# Patient Record
Sex: Female | Born: 1990 | Race: Black or African American | Hispanic: No | Smoking: Never smoker
Health system: Southern US, Community
[De-identification: ages and names within clinical notes are randomized; demographics above are authoritative.]

## PROBLEM LIST (undated history)

## (undated) HISTORY — PX: WISDOM TOOTH EXTRACTION: SHX21

---

## 2009-07-03 ENCOUNTER — Ambulatory Visit: Payer: Self-pay | Admitting: Family Medicine

## 2009-07-03 DIAGNOSIS — N912 Amenorrhea, unspecified: Secondary | ICD-10-CM

## 2009-07-03 DIAGNOSIS — R5381 Other malaise: Secondary | ICD-10-CM

## 2009-07-03 DIAGNOSIS — R5383 Other fatigue: Secondary | ICD-10-CM

## 2009-10-24 ENCOUNTER — Ambulatory Visit: Payer: Self-pay | Admitting: Family Medicine

## 2009-10-24 DIAGNOSIS — J209 Acute bronchitis, unspecified: Secondary | ICD-10-CM | POA: Insufficient documentation

## 2009-10-26 ENCOUNTER — Ambulatory Visit (HOSPITAL_COMMUNITY): Admission: RE | Admit: 2009-10-26 | Discharge: 2009-10-26 | Payer: Self-pay | Admitting: Family Medicine

## 2010-02-03 ENCOUNTER — Ambulatory Visit: Payer: Self-pay | Admitting: Family Medicine

## 2010-02-03 ENCOUNTER — Encounter: Payer: Self-pay | Admitting: Physician Assistant

## 2010-02-03 DIAGNOSIS — J039 Acute tonsillitis, unspecified: Secondary | ICD-10-CM | POA: Insufficient documentation

## 2010-02-04 ENCOUNTER — Telehealth: Payer: Self-pay | Admitting: Family Medicine

## 2010-07-08 ENCOUNTER — Encounter: Payer: Self-pay | Admitting: Physician Assistant

## 2010-07-08 ENCOUNTER — Ambulatory Visit: Payer: Self-pay | Admitting: Family Medicine

## 2010-07-08 DIAGNOSIS — R002 Palpitations: Secondary | ICD-10-CM | POA: Insufficient documentation

## 2010-07-08 LAB — CONVERTED CEMR LAB
MCHC: 32.9 g/dL (ref 30.0–36.0)
MCV: 85.3 fL (ref 78.0–100.0)
Platelets: 263 10*3/uL (ref 150–400)
RDW: 13 % (ref 11.5–15.5)
WBC: 4.8 10*3/uL (ref 4.0–10.5)

## 2010-07-09 ENCOUNTER — Encounter: Payer: Self-pay | Admitting: Physician Assistant

## 2010-11-02 ENCOUNTER — Encounter: Payer: Self-pay | Admitting: Family Medicine

## 2010-11-03 ENCOUNTER — Encounter: Payer: Self-pay | Admitting: Family Medicine

## 2010-11-11 NOTE — Letter (Signed)
Summary: Laboratory/X-Ray Results  [facility]  [address]   [place], [place] [address]   Phone: [phone]  Fax: [phone]    Lab/X-Ray Results  July 09, 2010  MRN: [id number]  Madison Harper [address] [place], [place]  [address]    The results of your recent lab/x-ray has been reviewed and were found:      Lab work is normal.   If you have any questions, please contact our office.     Esperanza Sheets PA

## 2010-11-11 NOTE — Progress Notes (Signed)
  Phone Note Call from Patient   Caller: Mom Summary of Call: mother states tylenol is not working for temperature, states 103  advised to give ibuprofen and if no improvement patient is to go to er- per Esperanza Sheets  mother agrees Initial call taken by: Adella Hare LPN,  February 04, 2010 4:43 PM

## 2010-11-11 NOTE — Letter (Signed)
Summary: Out of Work  [facility]  [address]   [place], [place] [address]   Phone: [phone]  Fax: [phone]    February 03, 2010   Employee:  YISSEL HABERMEHL    To Whom It May Concern:   For Medical reasons, please excuse the above named employee from work for the following dates:  Start:   02/03/10  End:   02/06/10 may return to work without restriction.  If you need additional information, please feel free to contact our office.         Sincerely,    Esperanza Sheets PA

## 2010-11-11 NOTE — Letter (Signed)
Summary: Out of Work  [facility]  [address]   [place], [place] [address]   Phone: [phone]  Fax: [phone]    October 24, 2009   Employee:  Madison Harper    To Whom It May Concern:   For Medical reasons, please excuse the above named employee from work for the following dates:  Start:   10/25/09  End:   10/31/09 to return with no restrictions  If you need additional information, please feel free to contact our office.         Sincerely,    Milus Mallick. Lodema Hong, MD

## 2010-11-11 NOTE — Assessment & Plan Note (Signed)
Summary: palpitations- room 2   Vital Signs:  Patient profile:   20 year old female Menstrual status:  irregular Height:      64 inches Weight:      106.13 pounds BMI:     18.28 O2 Sat:      100 % on Room air Pulse rate:   85 / minute Resp:     16 per minute BP sitting:   102 / 70  (left arm)  Vitals Entered By: Adella Hare LPN (July 08, 2010 1:48 PM)  Serial Vital Signs/Assessments:  Time      Position  BP       Pulse  Resp  Temp     By                              80                    Esperanza Sheets PA  CC: palpitations Is Patient Diabetic? No Pain Assessment Patient in pain? no        Primary Tonae Livolsi:  Kerri Perches, MD  CC:  palpitations.  History of Present Illness: Pt reports episodes of heart racing x 5 days.  No prev hx of.  Lasts x approx 3 mins.  Occurs at rest.  No  anxiety/panic.  NO chest pain or difficulty breathing. Admits to drinking caffeinated beverages all day for the last approx 1 week.  "and all I eat is [employer]un [employer] wild sauce."     Current Medications (verified): 1)  None  Allergies (verified): No Known Drug Allergies  Past History:  Past medical history reviewed for relevance to current acute and chronic problems.  Past Medical History: Reviewed history from 07/03/2009 and no changes required. childhood seizures, 2 in her lifetime was on medication, last  on med in 2nd grade  Review of Systems CV:  Complains of palpitations; denies chest pain or discomfort and shortness of breath with exertion. Resp:  Denies shortness of breath.  Physical Exam  General:  Well-developed,well-nourished,in no acute distress; alert,appropriate and cooperative throughout examination Head:  Normocephalic and atraumatic without obvious abnormalities. No apparent alopecia or balding. Ears:  External ear exam shows no significant lesions or deformities.  Otoscopic examination reveals clear canals, tympanic membranes are intact bilaterally  without bulging, retraction, inflammation or discharge. Hearing is grossly normal bilaterally. Nose:  External nasal examination shows no deformity or inflammation. Nasal mucosa are pink and moist without lesions or exudates. Mouth:  Oral mucosa and oropharynx without lesions or exudates.  Teeth in good repair. Neck:  No deformities, masses, or tenderness noted. Lungs:  Normal respiratory effort, chest expands symmetrically. Lungs are clear to auscultation, no crackles or wheezes. Heart:  Normal rate and regular rhythm. S1 and S2 normal without gallop, murmur, click, rub or other extra sounds. Cervical Nodes:  No lymphadenopathy noted Psych:  Cognition and judgment appear intact. Alert and cooperative with normal attention span and concentration. No apparent delusions, illusions, hallucinations   Impression & Recommendations:  Problem # 1:  PALPITATIONS (ICD-785.1) Assessment New  Discussed with pt that her increased caffeine intake is the most likely cause for her symptoms.  Discussed decreased caffeine intake and improved diet overall.  Orders: T-CBC No Diff ([id number]) T-TSH ([id number])  Other Orders: EKG w/ Interpretation (93[address])  Patient Instructions: 1)  Please schedule a follow-up appointment in 2 weeks. Sooner if worsens. 2)  I believe that  caffeine is causing your palpitations. I want you to decrease your caffeine intake by half , and gradually decrease your intake of caffeine to one 12oz serving a day or less.  If you get headaches from caffeine withdrawal you may use an over the counter pain reliever such as Ibuprofen.  Do not take an over the counter headache medicine that has caffeine in it. 3)  Have blood work drawn today.

## 2010-11-11 NOTE — Assessment & Plan Note (Signed)
Summary: sore throat- room 1   Vital Signs:  Patient profile:   20 year old female Menstrual status:  irregular Height:      64 inches Weight:      103.50 pounds BMI:     17.83 O2 Sat:      97 % on Room air Temp:     99.5 degrees F oral Pulse rate:   116 / minute Resp:     16 per minute BP sitting:   120 / 70  (left arm)  Vitals Entered By: Adella Hare LPN (February 03, 2010 9:11 AM) CC: sore throat, fever off and on, chills   x 3 days Is Patient Diabetic? No Pain Assessment Patient in pain? no        Primary Provider:  Kerri Perches, MD  CC:  sore throat, fever off and on, and chills   x 3 days.  History of Present Illness: This 20 Years Old Black Female comes in today complaining of a sore throat. The patient denies drooling, trouble swallowing, or trouble breathing. There is/is not a history of recent exposure to strep. The patient does not give a history of cold or URI symptoms.  Sore throat started Fri 4/22 and is worsening.  She is taking an over the counter cold medication with pain reliever in it to help. Appetitie is decreased, but no N/V. Has had some fatige which she contributes to the cold medication she is taking.    Current Medications (verified): 1)  None  Allergies (verified): No Known Drug Allergies  Past History:  Past medical history reviewed for relevance to current acute and chronic problems.  Past Medical History: Reviewed history from 07/03/2009 and no changes required. childhood seizures, 2 in her lifetime was on medication, last  on med in 2nd grade  Review of Systems General:  Complains of chills, fatigue, fever, and loss of appetite. ENT:  Complains of sore throat; denies earache and nasal congestion. CV:  Denies chest pain or discomfort. Resp:  Denies cough and shortness of breath. GI:  Complains of loss of appetite; denies abdominal pain, nausea, and vomiting.  Physical Exam  General:  Well-developed,well-nourished,in no acute  distress; alert,appropriate and cooperative throughout examination.   Head:  Normocephalic and atraumatic without obvious abnormalities. No apparent alopecia or balding. Ears:  External ear exam shows no significant lesions or deformities.  Otoscopic examination reveals clear canals, tympanic membranes are intact bilaterally without bulging, retraction, inflammation or discharge. Hearing is grossly normal bilaterally. Nose:  External nasal examination shows no deformity or inflammation. Nasal mucosa are pink and moist without lesions or exudates. Mouth:  Tonsils 3+ with exudate.  Mild erythema of uvula noted too. good dentition, no aphthous ulcers, no tongue abnormalities, and no petechiae.   Neck:  No deformities, masses, or tenderness noted. Lungs:  Normal respiratory effort, chest expands symmetrically. Lungs are clear to auscultation, no crackles or wheezes. Heart:  Normal rate and regular rhythm. S1 and S2 normal without gallop, murmur, click, rub or other extra sounds. Abdomen:  soft, non-tender, no hepatomegaly, and no splenomegaly.   Cervical Nodes:  1+ tonsilar nodes bilat. Psych:  Cognition and judgment appear intact. Alert and cooperative with normal attention span and concentration. No apparent delusions, illusions, hallucinations   Impression & Recommendations:  Problem # 1:  TONSILLITIS, ACUTE (ICD-463) Assessment New If no improv by Thurs 4/28 will order CBC & mono. Advised pt if she has increased swelling that causes difficulty breathing or swallowing she needs  to go to ER. Orders: Rocephin  250mg  (Y6948) Admin of Therapeutic Inj  intramuscular or subcutaneous (54627)  Complete Medication List: 1)  Amoxicillin 500 Mg Caps (Amoxicillin) .... Take 1 three times a day for ten days  Other Orders: Rapid Strep (03500)  Patient Instructions: 1)  Call the office on Thurs 02/06/10 if you are not noticing improvement.  Sooner if you feel that you are worsening. 2)  You may use  Tylenol or Ibuprofen as needed for fever and discomfort. 3)  Take 650-1000mg  of Tylenol every 4-6 hours as needed for relief of pain or comfort of fever AVOID taking more than 4000mg   in a 24 hour period (can cause liver damage in higher doses). 4)  Take 400-600mg  of Ibuprofen (Advil, Motrin) with food every 4-6 hours as needed for relief of pain or comfort of fever. 5)  Increase fluids. 6)  I have prescribed an antibiotic for you to take.  You can start this today. 7)  You have also received an antibiotic shot today, Rocephin. Prescriptions: AMOXICILLIN 500 MG CAPS (AMOXICILLIN) take 1 three times a day for ten days  #30 x 0   Entered and Authorized by:   Esperanza Sheets PA   Signed by:   Esperanza Sheets PA on 02/03/2010   Method used:   Electronically to        [facility] (retail)       [address]       [place], [place]  [address]       Ph: [phone]       Fax: [phone]   RxID:   [id number]   Laboratory Results  Date/Time Received: February 03, 2010  Date/Time Reported: February 03, 2010   Other Tests  Rapid Strep: negative     Medication Administration  Injection # 1:    Medication: Rocephin  250mg     Diagnosis: TONSILLITIS, ACUTE (ICD-463)    Route: IM    Site: RUOQ gluteus    Exp Date: 8/13    Lot #: [id number]    Mfr: [email or web address]    Comments: rocephin 1 gram given    Patient tolerated injection without complications    Given by: Adella Hare LPN (February 03, 2010 9:35 AM)  Orders Added: 1)  Rapid Strep [40086] 2)  Rocephin  250mg  [J0696] 3)  Admin of Therapeutic Inj  intramuscular or subcutaneous [96372] 4)  Est. Patient Level IV [76195]

## 2010-11-11 NOTE — Assessment & Plan Note (Signed)
Summary: SICK   Vital Signs:  Patient profile:   20 year old female Menstrual status:  irregular Height:      64 inches Weight:      104.25 pounds BMI:     17.96 O2 Sat:      98 % on Room air Temp:     98.1 degrees F oral Pulse rate:   96 / minute Pulse rhythm:   regular Resp:     16 per minute BP sitting:   100 / 70  (left arm)  Vitals Entered By: Worthy Keeler LPN (October 24, 2009 4:16 PM)  O2 Flow:  Room air CC: fever, sore throat, cough, yellow sputum, runny nose x 4 days Is Patient Diabetic? No Pain Assessment Patient in pain? no        Primary Care Provider:  Kerri Perches, MD  CC:  fever, sore throat, cough, yellow sputum, and runny nose x 4 days.  History of Present Illness: 4 day h/o cough, chest congestion with green sptum, fever and chills, positive sick contact. she denies sinus pressure, ear pain, nasal congestion or ore throat. She was doing well prior to this.  Allergies (verified): No Known Drug Allergies  Review of Systems      See HPI General:  Complains of chills, fatigue, fever, and malaise. ENT:  Denies hoarseness, nasal congestion, sinus pressure, and sore throat. Resp:  Complains of cough and sputum productive. GI:  Denies abdominal pain, constipation, diarrhea, nausea, and vomiting. GU:  Denies dysuria and urinary frequency; regular menstrual cycle on oCP.  Physical Exam  General:  Well-developed,well-nourished,in no acute distress; alert,appropriate and cooperative throughout examination. iLL appearingh HEENT: No facial asymmetry,  EOMI, No sinus tenderness, TM's Clear, oropharynx  pink and moist.   Chest: decreased air entry, bilateral crackles, no wheezes CVS: S1, S2, No murmurs, No S3.   Abd: Soft, Nontender.  MS: Adequate ROM spine, hips, shoulders and knees.  Ext: No edema.   CNS: CN 2-12 intact, power tone and sensation normal throughout.   Skin: Intact, no visible lesions or rashes.  Psych: Good eye contact, normal  affect.  Memory intact, not anxious or depressed appearing.    Impression & Recommendations:  Problem # 1:  ACUTE BRONCHITIS (ICD-466.0) Assessment Comment Only  Her updated medication list for this problem includes:    Penicillin V Potassium 500 Mg Tabs (Penicillin v potassium) .Marland Kitchen... Take 1 tablet by mouth three times a day    Tessalon Perles 100 Mg Caps (Benzonatate) .Marland Kitchen... Take 1 capsule by mouth three times a day  Orders: CXR- 2view (CXR) Rocephin  250mg  (Z3086) Admin of Therapeutic Inj  intramuscular or subcutaneous (57846)  Complete Medication List: 1)  Ortho Tri-cyclen (28) 0.18/0.215/0.25 Mg-35 Mcg Tabs (Norgestim-eth estrad triphasic) .... Start on 2nd day of cycle. take one tab once daily 2)  Provera 5 Mg Tabs (Medroxyprogesterone acetate) .... Take 1 tablet by mouth two times a day 3)  Penicillin V Potassium 500 Mg Tabs (Penicillin v potassium) .... Take 1 tablet by mouth three times a day 4)  Tessalon Perles 100 Mg Caps (Benzonatate) .... Take 1 capsule by mouth three times a day  Patient Instructions: 1)  Pls reschedule you CPE. 2)  You have bronchitis. 3)  You will get and injection of penicillin and meds are being sent in Prescriptions: TESSALON PERLES 100 MG CAPS (BENZONATATE) Take 1 capsule by mouth three times a day  #30 x 0   Entered and Authorized by:  Syliva Overman MD   Signed by:   Syliva Overman MD on 10/24/2009   Method used:   Print then Give to Patient   RxID:   [id number] PENICILLIN V POTASSIUM 500 MG TABS (PENICILLIN V POTASSIUM) Take 1 tablet by mouth three times a day  #30 x 0   Entered and Authorized by:   Syliva Overman MD   Signed by:   Syliva Overman MD on 10/24/2009   Method used:   Print then Give to Patient   RxID:   [id number]    Medication Administration  Injection # 1:    Medication: Rocephin  250mg     Diagnosis: ACUTE BRONCHITIS (ICD-466.0)    Route: IM    Site: LUOQ gluteus    Exp Date: 9/12    Lot #:  [id number]    Mfr: [email or web address]    Comments: rocephin 500mg  given    Patient tolerated injection without complications    Given by: Worthy Keeler LPN (October 25, 2009 8:38 AM)  Orders Added: 1)  Est. Patient Level III [50932] 2)  CXR- 2view [CXR] 3)  Rocephin  250mg  [J0696] 4)  Admin of Therapeutic Inj  intramuscular or subcutaneous [67124]

## 2011-03-24 ENCOUNTER — Encounter: Payer: Self-pay | Admitting: Family Medicine

## 2011-03-25 ENCOUNTER — Encounter: Payer: Self-pay | Admitting: Family Medicine

## 2011-03-26 ENCOUNTER — Ambulatory Visit (INDEPENDENT_AMBULATORY_CARE_PROVIDER_SITE_OTHER): Payer: Self-pay | Admitting: Family Medicine

## 2011-03-26 ENCOUNTER — Encounter: Payer: Self-pay | Admitting: Family Medicine

## 2011-03-26 VITALS — BP 100/70 | HR 84 | Resp 16 | Ht 64.5 in | Wt 107.1 lb

## 2011-03-26 DIAGNOSIS — R51 Headache: Secondary | ICD-10-CM

## 2011-03-26 DIAGNOSIS — R519 Headache, unspecified: Secondary | ICD-10-CM | POA: Insufficient documentation

## 2011-03-26 MED ORDER — IBUPROFEN 800 MG PO TABS: 800.0000 mg | ORAL_TABLET | Freq: Three times a day (TID) | ORAL | Status: AC | PRN

## 2011-03-26 MED ORDER — IBUPROFEN 800 MG PO TABS: 800.0000 mg | ORAL_TABLET | Freq: Two times a day (BID) | ORAL | Status: AC | PRN

## 2011-03-26 MED ORDER — PREDNISONE (PAK) 5 MG PO TABS: 5.0000 mg | ORAL_TABLET | ORAL | Status: AC

## 2011-03-26 NOTE — Patient Instructions (Signed)
F/u recommended call for appt.  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 Palpitations need to be evaluated fully by cardiology, let me know when you want this

## 2011-03-26 NOTE — Progress Notes (Signed)
  Subjective:    Patient ID: Madison Harper, female    DOB: 1990/12/03, 20 y.o.   MRN: [id number]  HPI 1 month  H/o bitemporal throbbing headaches on avg every daily, relieved by tylenol to some extent, lasts for approximately 2 hours.Experiences dizziness  With the headache , aggravated by light. Feels her heart racing intermittently x 2 years on avg twice per month, no other symptomms, denies chest pain, light headedness, orthopnea or PND    Review of Systems Denies recent fever or chills. Denies sinus pressure, nasal congestion, ear pain or sore throat. Denies chest congestion, productive cough or wheezing.  Denies abdominal pain, vomiting,diarrhea or constipation.    Denies  seizure, numbness, or tingling. Denies depression, anxiety or insomnia. Denies skin break down or rash.        Objective:   Physical Exam Patient alert and oriented and in no Cardiopulmonary distress.  HEENT: No facial asymmetry, EOMI, no sinus tenderness, TM's clear, Oropharynx pink and moist.  Neck supple no adenopathy. Fundoscopy: no hemorhage or exudate Chest: Clear to auscultation bilaterally.  CVS: S1, S2 no murmurs, no S3.  ABD: Soft non tender. Bowel sounds normal.  Ext: No edema  MS: Adequate ROM spine, shoulders, hips and knees.  Skin: Intact, no ulcerations or rash noted.  Psych: Good eye contact, normal affect. Memory intact not anxious or depressed appearing.  CNS: CN 2-12 intact, power, tone and sensation normal throughout.        Assessment & Plan:

## 2011-04-01 ENCOUNTER — Other Ambulatory Visit: Payer: Self-pay | Admitting: Family Medicine

## 2011-04-01 ENCOUNTER — Ambulatory Visit (HOSPITAL_COMMUNITY)
Admission: RE | Admit: 2011-04-01 | Discharge: 2011-04-01 | Disposition: A | Discharge status: Home / Self Care | Payer: BC Managed Care – PPO | Source: Ambulatory Visit | Attending: Family Medicine | Admitting: Family Medicine

## 2011-04-01 DIAGNOSIS — R51 Headache: Secondary | ICD-10-CM

## 2011-04-01 DIAGNOSIS — R42 Dizziness and giddiness: Secondary | ICD-10-CM | POA: Insufficient documentation

## 2011-04-01 DIAGNOSIS — G939 Disorder of brain, unspecified: Secondary | ICD-10-CM | POA: Insufficient documentation

## 2011-04-01 NOTE — Progress Notes (Signed)
will refer to neurology re headasche and fax brain scan report

## 2011-04-05 NOTE — Assessment & Plan Note (Signed)
New disabling headaches x 1 month

## 2011-10-05 ENCOUNTER — Emergency Department (HOSPITAL_COMMUNITY)
Admission: EM | Admit: 2011-10-05 | Discharge: 2011-10-05 | Disposition: A | Discharge status: Home / Self Care | Payer: 59 | Attending: Emergency Medicine | Admitting: Emergency Medicine

## 2011-10-05 ENCOUNTER — Encounter (HOSPITAL_COMMUNITY): Payer: Self-pay | Admitting: *Deleted

## 2011-10-05 DIAGNOSIS — R05 Cough: Secondary | ICD-10-CM | POA: Insufficient documentation

## 2011-10-05 DIAGNOSIS — J069 Acute upper respiratory infection, unspecified: Secondary | ICD-10-CM

## 2011-10-05 DIAGNOSIS — J029 Acute pharyngitis, unspecified: Secondary | ICD-10-CM | POA: Insufficient documentation

## 2011-10-05 DIAGNOSIS — R059 Cough, unspecified: Secondary | ICD-10-CM | POA: Insufficient documentation

## 2011-10-05 LAB — RAPID STREP SCREEN (MED CTR MEBANE ONLY): Streptococcus, Group A Screen (Direct): NEGATIVE

## 2011-10-05 MED ORDER — IBUPROFEN 400 MG PO TABS
400.0000 mg | ORAL_TABLET | Freq: Once | ORAL | Status: AC
  Administered 2011-10-05: 400 mg via ORAL
  Filled 2011-10-05: qty 1

## 2011-10-05 MED ORDER — IBUPROFEN 600 MG PO TABS: 600.0000 mg | ORAL_TABLET | Freq: Four times a day (QID) | ORAL | Status: AC | PRN

## 2011-10-05 NOTE — ED Provider Notes (Signed)
History     CSN: [id number]  Arrival date & time 10/05/11  4098   First MD Initiated Contact with Patient 10/05/11 1045      Chief Complaint  Patient presents with  . Cough    (Consider location/radiation/quality/duration/timing/severity/associated sxs/prior treatment) Patient is a 20 y.o. female presenting with cough. The history is provided by the patient.  Cough This is a new problem. Episode onset: 5 days ago. The problem occurs every few minutes. The problem has been rapidly improving (cough has improved with use of delsym). The cough is non-productive. Maximum temperature: subjective fever. Associated symptoms include rhinorrhea and sore throat. Pertinent negatives include no chest pain, no chills, no sweats, no ear pain, no headaches, no shortness of breath and no wheezing. She has tried cough syrup for the symptoms. The treatment provided moderate relief. She is not a smoker. Her past medical history does not include pneumonia or asthma.    History reviewed. No pertinent past medical history.  History reviewed. No pertinent past surgical history.  Family History  Problem Relation Age of Onset  . Hypertension Mother   . Hypertension Father   . Diabetes Father     History  Substance Use Topics  . Smoking status: Never Smoker   . Smokeless tobacco: Not on file  . Alcohol Use: No    OB History    Grav Para Term Preterm Abortions TAB SAB Ect Mult Living                  Review of Systems  Constitutional: Negative for fever and chills.  HENT: Positive for sore throat and rhinorrhea. Negative for ear pain, congestion and neck pain.   Eyes: Negative.   Respiratory: Positive for cough. Negative for chest tightness, shortness of breath and wheezing.   Cardiovascular: Negative for chest pain.  Gastrointestinal: Negative for nausea and abdominal pain.  Genitourinary: Negative.   Musculoskeletal: Negative for joint swelling and arthralgias.  Skin: Negative.  Negative  for rash and wound.  Neurological: Negative for dizziness, weakness, light-headedness, numbness and headaches.  Hematological: Negative.   Psychiatric/Behavioral: Negative.     Allergies  Review of patient's allergies indicates no known allergies.  Home Medications   Current Outpatient Rx  Name Route Sig Dispense Refill  . DEXTROMETHORPHAN POLISTIREX ER 30 MG/5ML PO LQCR Oral Take 60 mg by mouth as needed. cold     . IBUPROFEN 600 MG PO TABS Oral Take 1 tablet (600 mg total) by mouth every 6 (six) hours as needed for pain. 20 tablet 0    BP 117/81  Pulse 82  Temp(Src) 98.1 F (36.7 C) (Oral)  Resp 16  Ht 5\' 4"  (1.626 m)  Wt 120 lb (54.432 kg)  BMI 20.60 kg/m2  SpO2 100%  LMP 09/21/2011  Physical Exam  Nursing note and vitals reviewed. Constitutional: She is oriented to person, place, and time. She appears well-developed and well-nourished.  HENT:  Head: Normocephalic and atraumatic.  Right Ear: External ear normal.  Left Ear: External ear normal.  Mouth/Throat: Uvula is midline and mucous membranes are normal. Posterior oropharyngeal erythema present. No oropharyngeal exudate, posterior oropharyngeal edema or tonsillar abscesses.  Eyes: Conjunctivae are normal.  Neck: Normal range of motion.  Cardiovascular: Normal rate, regular rhythm, normal heart sounds and intact distal pulses.   Pulmonary/Chest: Effort normal and breath sounds normal. She has no wheezes.  Abdominal: Soft. Bowel sounds are normal. There is no tenderness.  Musculoskeletal: Normal range of motion.  Neurological: She is  alert and oriented to person, place, and time.  Skin: Skin is warm and dry.  Psychiatric: She has a normal mood and affect.    ED Course  Procedures (including critical care time)   Labs Reviewed  RAPID STREP SCREEN   No results found.   1. Upper respiratory infection       MDM  Glenford Peers.        Candis Musa, PA 10/05/11 [phone]

## 2011-10-05 NOTE — ED Notes (Signed)
Pt c/o cough, congestion, headache, sore throat, fever and shortness of breath since Wednesday.

## 2011-10-06 NOTE — ED Provider Notes (Signed)
Medical screening examination/treatment/procedure(s) were performed by non-physician practitioner and as supervising physician I was immediately available for consultation/collaboration.  Shelda Jakes, MD 10/06/11 [phone]

## 2011-10-08 ENCOUNTER — Telehealth: Payer: Self-pay | Admitting: Family Medicine

## 2011-10-08 NOTE — Telephone Encounter (Signed)
Spoke with mother of pt and informed her that if the symptoms became worse or she complains of a sore throat that she needed to go to urgent care or the ed.  Transferred to make an appointment.

## 2011-10-12 ENCOUNTER — Encounter: Payer: Self-pay | Admitting: Family Medicine

## 2011-10-14 ENCOUNTER — Ambulatory Visit: Payer: 59 | Admitting: Family Medicine

## 2012-01-22 ENCOUNTER — Ambulatory Visit: Payer: 59 | Admitting: Family Medicine

## 2012-01-25 ENCOUNTER — Ambulatory Visit (INDEPENDENT_AMBULATORY_CARE_PROVIDER_SITE_OTHER): Payer: 59 | Admitting: Family Medicine

## 2012-01-25 ENCOUNTER — Encounter: Payer: Self-pay | Admitting: Family Medicine

## 2012-01-25 VITALS — BP 114/72 | HR 73 | Resp 18 | Ht 64.5 in | Wt 111.1 lb

## 2012-01-25 DIAGNOSIS — Z13220 Encounter for screening for lipoid disorders: Secondary | ICD-10-CM

## 2012-01-25 DIAGNOSIS — R5383 Other fatigue: Secondary | ICD-10-CM

## 2012-01-25 DIAGNOSIS — R5381 Other malaise: Secondary | ICD-10-CM

## 2012-01-25 DIAGNOSIS — J0100 Acute maxillary sinusitis, unspecified: Secondary | ICD-10-CM

## 2012-01-25 MED ORDER — FLUCONAZOLE 150 MG PO TABS: ORAL_TABLET | ORAL | Status: AC

## 2012-01-25 MED ORDER — SULFAMETHOXAZOLE-TRIMETHOPRIM 800-160 MG PO TABS: 1.0000 | ORAL_TABLET | Freq: Two times a day (BID) | ORAL | Status: AC

## 2012-01-25 NOTE — Assessment & Plan Note (Addendum)
Right max sinusitis, antibiotic course prescribed, pt also encouraged to do saline flushes of sinuses

## 2012-01-25 NOTE — Patient Instructions (Signed)
CPE in 2 month  Medication is sent in for acute right maxillary sinusitis, please take entire 10 day course.  Fasting cbc, chem 7, lipid, tsh and vit D in 2 months, before visit

## 2012-01-25 NOTE — Progress Notes (Signed)
  Subjective:    Patient ID: Harlen Labs, female    DOB: 29-Jul-1991, 21 y.o.   MRN: [id number]  HPI 1 week h/o right facial pressure with green drainage and intermittent chills. Denies sore throat or productive cough. Otherwise has no complaints    Review of Systems See HPI  Denies  ear pain or sore throat. Denies chest congestion, productive cough or wheezing. Denies chest pains, palpitations and leg swelling Denies abdominal pain, nausea, vomiting,diarrhea or constipation.   Denies dysuria, frequency, hesitancy or incontinence. Denies joint pain, swelling and limitation in mobility. Denies headaches, seizures, numbness, or tingling. Denies depression, anxiety or insomnia. Denies skin break down or rash.        Objective:   Physical Exam  Patient alert and oriented and in no cardiopulmonary distress.  HEENT: No facial asymmetry, EOMI, right maxillary  sinus tenderness,  oropharynx pink and moist.  Neck supple no adenopathy.  Chest: Clear to auscultation bilaterally.  CVS: S1, S2 no murmurs, no S3.  ABD: Soft non tender. Bowel sounds normal.  Ext: No edema  MS: Adequate ROM spine, shoulders, hips and knees.  Skin: Intact, no ulcerations or rash noted.  Psych: Good eye contact, normal affect. Memory intact not anxious or depressed appearing.  CNS: CN 2-12 intact, power, tone and sensation normal throughout.       Assessment & Plan:

## 2012-03-30 ENCOUNTER — Encounter: Payer: 59 | Admitting: Family Medicine

## 2014-03-22 ENCOUNTER — Ambulatory Visit (HOSPITAL_COMMUNITY)
Admission: RE | Admit: 2014-03-22 | Discharge: 2014-03-22 | Disposition: A | Discharge status: Home / Self Care | Payer: BC Managed Care – PPO | Source: Ambulatory Visit | Attending: Family Medicine | Admitting: Family Medicine

## 2014-03-22 ENCOUNTER — Telehealth: Payer: Self-pay

## 2014-03-22 DIAGNOSIS — M79609 Pain in unspecified limb: Secondary | ICD-10-CM | POA: Insufficient documentation

## 2014-03-22 DIAGNOSIS — M79642 Pain in left hand: Secondary | ICD-10-CM

## 2014-03-22 NOTE — Telephone Encounter (Signed)
Noted and agree, if xray is normal and she still has symptoms she needs to see an orthopedic Doc pls let her know

## 2014-03-22 NOTE — Telephone Encounter (Signed)
Patient aware and will have imaging

## 2014-03-28 ENCOUNTER — Ambulatory Visit: Payer: 59 | Admitting: Family Medicine

## 2014-11-10 ENCOUNTER — Emergency Department (HOSPITAL_COMMUNITY)
Admission: EM | Admit: 2014-11-10 | Discharge: 2014-11-10 | Disposition: A | Discharge status: Home / Self Care | Payer: BLUE CROSS/BLUE SHIELD | Attending: Emergency Medicine | Admitting: Emergency Medicine

## 2014-11-10 ENCOUNTER — Encounter (HOSPITAL_COMMUNITY): Payer: Self-pay | Admitting: Emergency Medicine

## 2014-11-10 DIAGNOSIS — N390 Urinary tract infection, site not specified: Secondary | ICD-10-CM

## 2014-11-10 DIAGNOSIS — R000 Tachycardia, unspecified: Secondary | ICD-10-CM | POA: Diagnosis not present

## 2014-11-10 DIAGNOSIS — R42 Dizziness and giddiness: Secondary | ICD-10-CM | POA: Diagnosis present

## 2014-11-10 DIAGNOSIS — Z3202 Encounter for pregnancy test, result negative: Secondary | ICD-10-CM | POA: Diagnosis not present

## 2014-11-10 LAB — COMPREHENSIVE METABOLIC PANEL
ALT: 22 U/L (ref 0–35)
AST: 25 U/L (ref 0–37)
Albumin: 4.9 g/dL (ref 3.5–5.2)
Alkaline Phosphatase: 72 U/L (ref 39–117)
Anion gap: 9 (ref 5–15)
BUN: 15 mg/dL (ref 6–23)
CO2: 27 mmol/L (ref 19–32)
CREATININE: 0.79 mg/dL (ref 0.50–1.10)
Calcium: 9.8 mg/dL (ref 8.4–10.5)
Chloride: 103 mmol/L (ref 96–112)
GFR calc Af Amer: >90 mL/min (ref >90)
GLUCOSE: 103 mg/dL — AB (ref 70–99)
POTASSIUM: 4.3 mmol/L (ref 3.5–5.1)
Sodium: 139 mmol/L (ref 135–145)
TOTAL PROTEIN: 8.6 g/dL — AB (ref 6.0–8.3)
Total Bilirubin: 0.9 mg/dL (ref 0.3–1.2)

## 2014-11-10 LAB — CBC WITH DIFFERENTIAL/PLATELET
BASOS ABS: 0 10*3/uL (ref 0.0–0.1)
BASOS PCT: 0 % (ref 0–1)
EOS PCT: 0 % (ref 0–5)
Eosinophils Absolute: 0 10*3/uL (ref 0.0–0.7)
HCT: 43.6 % (ref 36.0–46.0)
HEMOGLOBIN: 14.2 g/dL (ref 12.0–15.0)
LYMPHS ABS: 0.3 10*3/uL — AB (ref 0.7–4.0)
LYMPHS PCT: 4 % — AB (ref 12–46)
MCH: 28.9 pg (ref 26.0–34.0)
MCHC: 32.6 g/dL (ref 30.0–36.0)
MCV: 88.6 fL (ref 78.0–100.0)
MONO ABS: 0.4 10*3/uL (ref 0.1–1.0)
MONOS PCT: 6 % (ref 3–12)
Neutro Abs: 6.6 10*3/uL (ref 1.7–7.7)
Neutrophils Relative %: 90 % — ABNORMAL HIGH (ref 43–77)
PLATELETS: 222 10*3/uL (ref 150–400)
RBC: 4.92 MIL/uL (ref 3.87–5.11)
RDW: 12.6 % (ref 11.5–15.5)
WBC: 7.3 10*3/uL (ref 4.0–10.5)

## 2014-11-10 LAB — URINALYSIS, ROUTINE W REFLEX MICROSCOPIC
Bilirubin Urine: NEGATIVE
Glucose, UA: NEGATIVE mg/dL
Ketones, ur: NEGATIVE mg/dL
NITRITE: NEGATIVE
PROTEIN: NEGATIVE mg/dL
SPECIFIC GRAVITY, URINE: 1.015 (ref 1.005–1.030)
Urobilinogen, UA: 0.2 mg/dL (ref 0.0–1.0)
pH: 6 (ref 5.0–8.0)

## 2014-11-10 LAB — URINE MICROSCOPIC-ADD ON

## 2014-11-10 LAB — PREGNANCY, URINE: Preg Test, Ur: NEGATIVE

## 2014-11-10 MED ORDER — ONDANSETRON 4 MG PO TBDP: ORAL_TABLET | ORAL | Status: DC

## 2014-11-10 MED ORDER — ACETAMINOPHEN 500 MG PO TABS
1000.0000 mg | ORAL_TABLET | Freq: Once | ORAL | Status: AC
  Administered 2014-11-10: 1000 mg via ORAL
  Filled 2014-11-10: qty 2

## 2014-11-10 MED ORDER — CEPHALEXIN 500 MG PO CAPS: 500.0000 mg | ORAL_CAPSULE | Freq: Four times a day (QID) | ORAL | Status: DC

## 2014-11-10 MED ORDER — SODIUM CHLORIDE 0.9 % IV BOLUS (SEPSIS)
1000.0000 mL | Freq: Once | INTRAVENOUS | Status: AC
  Administered 2014-11-10: 1000 mL via INTRAVENOUS

## 2014-11-10 NOTE — ED Provider Notes (Signed)
CSN: [id number]     Arrival date & time 11/10/14  0621 History  This chart was scribed for Benny LennertJoseph L Pearline Yerby, MD by Roxy Cedarhandni Bhalodia, ED Scribe. This patient was seen in room APA04/APA04 and the patient's care was started at 7:16 AM.   Chief Complaint  Patient presents with  . Dizziness  . Nausea   Patient is a 24 y.o. female presenting with dizziness. The history is provided by the patient (pt. complains of nausea and dizziness). No language interpreter was used.  Dizziness Quality:  Room spinning Severity:  Moderate Onset quality:  Sudden Timing:  Constant Progression:  Waxing and waning Chronicity:  New Relieved by:  Nothing Worsened by:  Nothing tried Ineffective treatments:  None tried Associated symptoms: nausea and weakness   Associated symptoms: no chest pain, no diarrhea and no headaches    HPI Comments: Madison Labstavia Haag is a 24 y.o. female who presents to the Emergency Department complaining of dizziness and nausea that began at 12:30 AM earlier today while at work.  She states that she feels like the room is spinning. Patient has not taken any medications prior to arrival. Patient has fever upon arrival. Patient denies associated sore throat and rhinorrhea.  History reviewed. No pertinent past medical history. History reviewed. No pertinent past surgical history. Family History  Problem Relation Age of Onset  . Hypertension Mother   . Hypertension Father   . Diabetes Father    History  Substance Use Topics  . Smoking status: Never Smoker   . Smokeless tobacco: Not on file  . Alcohol Use: No   OB History    No data available     Review of Systems  Constitutional: Negative for appetite change and fatigue.  HENT: Negative for congestion, ear discharge and sinus pressure.   Eyes: Negative for discharge.  Respiratory: Negative for cough.   Cardiovascular: Negative for chest pain.  Gastrointestinal: Positive for nausea. Negative for abdominal pain and diarrhea.   Genitourinary: Negative for frequency and hematuria.  Musculoskeletal: Negative for back pain.  Skin: Negative for rash.  Neurological: Positive for dizziness. Negative for seizures and headaches.  Psychiatric/Behavioral: Negative for hallucinations.   Allergies  Review of patient's allergies indicates no known allergies.  Home Medications   Prior to Admission medications   Not on File   Triage Vitals: BP 129/90 mmHg  Pulse 111  Temp(Src) 100.4 F (38 C)  Resp 20  Ht 5\' 4"  (1.626 m)  Wt 115 lb (52.164 kg)  BMI 19.73 kg/m2  SpO2 99%  Physical Exam  Constitutional: She is oriented to person, place, and time. She appears well-developed.  HENT:  Head: Normocephalic.  Eyes: Conjunctivae and EOM are normal. No scleral icterus.  Neck: Neck supple. No thyromegaly present.  Cardiovascular: Regular rhythm.  Tachycardia present.  Exam reveals no gallop and no friction rub.   No murmur heard. Pulmonary/Chest: No stridor. She has no wheezes. She has no rales. She exhibits no tenderness.  Abdominal: She exhibits no distension. There is no tenderness. There is no rebound.  Musculoskeletal: Normal range of motion. She exhibits no edema.  Lymphadenopathy:    She has no cervical adenopathy.  Neurological: She is oriented to person, place, and time. She exhibits normal muscle tone. Coordination normal.  Skin: No rash noted. No erythema.  Psychiatric: She has a normal mood and affect. Her behavior is normal.   ED Course  Procedures (including critical care time)  DIAGNOSTIC STUDIES: Oxygen Saturation is 99% on RA, normal by  my interpretation.    COORDINATION OF CARE: 7:22 AM- Discussed plans to order diagnostic EKG, lab work, pregnancy test and urinalysis. Will give patient tylenol and IV fluids. Pt advised of plan for treatment and pt agrees.  Harper Review Harper Reviewed  PREGNANCY, URINE  CBC WITH DIFFERENTIAL/PLATELET  COMPREHENSIVE METABOLIC PANEL  URINALYSIS, ROUTINE W REFLEX  MICROSCOPIC   Imaging Review No results found.   EKG Interpretation   Date/Time:  Saturday November 10 2014 06:48:13 EST Ventricular Rate:  117 PR Interval:  163 QRS Duration: 75 QT Interval:  319 QTC Calculation: 445 R Axis:   70 Text Interpretation:  Sinus tachycardia Confirmed by Porschia Willbanks  MD, Ranald Alessio  ([id number]) on 11/10/2014 8:37:27 AM     MDM   Final diagnoses:  None    Fever, possible uti,  tx with tylenol and keflex  I personally performed the services described in this documentation, which was scribed in my presence. The recorded information has been reviewed and is accurate.  Benny Lennert, MD 11/10/14 [phone]

## 2014-11-10 NOTE — ED Notes (Signed)
Pt reports that around midnight she felt nauseous and dizzy with hot flashes. States it went away but returned earlier this morning.

## 2014-11-10 NOTE — Discharge Instructions (Signed)
Drink plenty of fluids.  Follow-up next week for recheck ?

## 2014-11-10 NOTE — ED Notes (Signed)
MD at bedside. 

## 2014-11-12 LAB — URINE CULTURE: Colony Count: 50000

## 2014-11-13 ENCOUNTER — Telehealth (HOSPITAL_BASED_OUTPATIENT_CLINIC_OR_DEPARTMENT_OTHER): Payer: Self-pay | Admitting: Emergency Medicine

## 2014-11-13 NOTE — Telephone Encounter (Signed)
Post ED Visit - Positive Culture Follow-up  Culture report reviewed by antimicrobial stewardship pharmacist: []  Wes Dulaney, Pharm.D., BCPS [x]  Celedonio MiyamotoJeremy Frens, Pharm.D., BCPS []  Georgina PillionElizabeth Martin, Pharm.D., BCPS []  HesterMinh Pham, [address]Ph[address]D., BCPS, AAHIVP []  Estella HuskMichelle Turner, Pharm.D., BCPS, AAHIVP []  Elder CyphersLorie Poole, [address]Pharm[address]D., BCPS  Positive urine culture Group B Strep Treated with cephalexin, organism sensitive to the same and no further patient follow-up is required at this time.  Berle MullMiller, Tricia Pledger 11/13/2014, 9:26 AM

## 2014-11-30 ENCOUNTER — Encounter: Payer: Self-pay | Admitting: Family Medicine

## 2014-11-30 ENCOUNTER — Ambulatory Visit (INDEPENDENT_AMBULATORY_CARE_PROVIDER_SITE_OTHER): Payer: BLUE CROSS/BLUE SHIELD | Admitting: Family Medicine

## 2014-11-30 VITALS — BP 108/70 | HR 93 | Resp 16 | Ht 64.5 in | Wt 119.1 lb

## 2014-11-30 DIAGNOSIS — J209 Acute bronchitis, unspecified: Secondary | ICD-10-CM

## 2014-11-30 DIAGNOSIS — J029 Acute pharyngitis, unspecified: Secondary | ICD-10-CM | POA: Insufficient documentation

## 2014-11-30 DIAGNOSIS — J309 Allergic rhinitis, unspecified: Secondary | ICD-10-CM

## 2014-11-30 LAB — POCT RAPID STREP A (OFFICE): RAPID STREP A SCREEN: NEGATIVE

## 2014-11-30 MED ORDER — MOMETASONE FUROATE 50 MCG/ACT NA SUSP: 2.0000 | Freq: Every day | NASAL | Status: DC

## 2014-11-30 MED ORDER — BENZONATATE 100 MG PO CAPS: 100.0000 mg | ORAL_CAPSULE | Freq: Two times a day (BID) | ORAL | Status: DC | PRN

## 2014-11-30 MED ORDER — PREDNISONE 5 MG PO TABS: 5.0000 mg | ORAL_TABLET | Freq: Two times a day (BID) | ORAL | Status: AC

## 2014-11-30 MED ORDER — AZITHROMYCIN 250 MG PO TABS: ORAL_TABLET | ORAL | Status: DC

## 2014-11-30 NOTE — Patient Instructions (Addendum)
CPE in 2 month, call if you need me before  You are treated for acute bronchitis and  uncontrolled allergies

## 2014-11-30 NOTE — Progress Notes (Signed)
   Subjective:    Patient ID: Madison Harper, female    DOB: 10/28/1990, 24 y.o.   MRN: [id number]  HPI 2 day  h/o worsening head and chest congestion, associated with fever and chills intermittently. Nasal drainage has thickened , and is yellowish green, and at times bloody. Sputum is thick and yellow. C/o sore throat. Increasing fatigue , poor appetitie and sleep disturbed by cough. No improvement with OTC medication. 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   Denies dysuria, frequency, hesitancy or incontinence. Denies joint pain, swelling and limitation in mobility. Denies headaches, seizures, numbness, or tingling. Denies depression, anxiety or insomnia. Denies skin break down or rash.        Objective:   Physical Exam BP 108/70 mmHg  Pulse 93  Resp 16  Ht 5' 4.5" (1.638 m)  Wt 119 lb 1.9 oz (54.032 kg)  BMI 20.14 kg/m2  SpO2 97% Patient alert and oriented and in no cardiopulmonary distress.  HEENT: No facial asymmetry, EOMI,   oropharynx pink and moist.  Neck supple no JVD, no mass. Bilateral anterior lymphadenitis, no sinus tenderness, tM clear, erythema and edema of nasal mucosa Chest: Adequate air entry, scattered crackles , no wheezes  CVS: S1, S2 no murmurs, no S3.Regular rate.  ABD: Soft non tender.   Ext: No edema  MS: Adequate ROM spine, shoulders, hips and knees.  Skin: Intact, no ulcerations or rash noted.  Psych: Good eye contact, normal affect. Memory intact not anxious or depressed appearing.  CNS: CN 2-12 intact, power,  normal throughout.no focal deficits noted.        Assessment & Plan:  ACUTE BRONCHITIS Decongestant and antibiotic prescribed   Acute pharyngitis Throat swab negative for strep   Allergic rhinitis Uncontrolled, start nasonex daily and  prednisone prescribed, short course

## 2014-12-02 NOTE — Assessment & Plan Note (Signed)
Throat swab negative for strep

## 2014-12-02 NOTE — Assessment & Plan Note (Signed)
Decongestant and antibiotic prescribed 

## 2014-12-02 NOTE — Assessment & Plan Note (Signed)
Uncontrolled, start nasonex daily and prednisone prescribed, short course

## 2015-07-29 ENCOUNTER — Encounter (HOSPITAL_COMMUNITY): Payer: Self-pay | Admitting: Emergency Medicine

## 2015-07-29 ENCOUNTER — Emergency Department (HOSPITAL_COMMUNITY)
Admission: EM | Admit: 2015-07-29 | Discharge: 2015-07-29 | Disposition: A | Discharge status: Home / Self Care | Payer: BLUE CROSS/BLUE SHIELD | Attending: Emergency Medicine | Admitting: Emergency Medicine

## 2015-07-29 DIAGNOSIS — Z7951 Long term (current) use of inhaled steroids: Secondary | ICD-10-CM | POA: Insufficient documentation

## 2015-07-29 DIAGNOSIS — L02413 Cutaneous abscess of right upper limb: Secondary | ICD-10-CM | POA: Diagnosis present

## 2015-07-29 MED ORDER — DOXYCYCLINE HYCLATE 100 MG PO TABS
100.0000 mg | ORAL_TABLET | Freq: Once | ORAL | Status: AC
  Administered 2015-07-29: 100 mg via ORAL
  Filled 2015-07-29: qty 1

## 2015-07-29 MED ORDER — IBUPROFEN 800 MG PO TABS
800.0000 mg | ORAL_TABLET | Freq: Once | ORAL | Status: AC
  Administered 2015-07-29: 800 mg via ORAL
  Filled 2015-07-29: qty 1

## 2015-07-29 MED ORDER — HYDROCODONE-ACETAMINOPHEN 5-325 MG PO TABS: 1.0000 | ORAL_TABLET | ORAL | Status: DC | PRN

## 2015-07-29 MED ORDER — ACETAMINOPHEN 325 MG PO TABS
650.0000 mg | ORAL_TABLET | Freq: Once | ORAL | Status: AC
  Administered 2015-07-29: 650 mg via ORAL
  Filled 2015-07-29: qty 2

## 2015-07-29 MED ORDER — DOXYCYCLINE HYCLATE 100 MG PO CAPS: 100.0000 mg | ORAL_CAPSULE | Freq: Two times a day (BID) | ORAL | Status: DC

## 2015-07-29 NOTE — ED Notes (Signed)
Pt reports she developed a small bump on her R forearm on last Wed. Today area is edematous,red and warm to touch

## 2015-07-29 NOTE — ED Provider Notes (Signed)
CSN: [id number]     Arrival date & time 07/29/15  1816 History  By signing my name below, I, Madison Harper, attest that this documentation has been prepared under the direction and in the presence of [name], PA-C.  Electronically Signed: [name], ED Scribe. 07/29/2015. 6:57 PM.   Chief Complaint  Patient presents with  . Abscess   The history is provided by the patient. No language interpreter was used.     HPI Comments: Madison Harper is a 24 y.o. female who presents to the Emergency Department complaining of gradual onset, constant, abscess to right forearm x 6 days, gradually worsening. Pt notes intermittent pain to the area that radiates up right arm. A nurse that pt knows told her to take Benadryl and apply warm compresses which pt has been doing without relief. She notes that the nurse pricked the area today with a needle and a small amount of purulence came out. She denies fever, chills, or any other associated symptoms. No risk of pregnancy. Not currently breastfeeding.    History reviewed. No pertinent past medical history. History reviewed. No pertinent past surgical history. Family History  Problem Relation Age of Onset  . Hypertension Mother   . Hypertension Father   . Diabetes Father    Social History  Substance Use Topics  . Smoking status: Never Smoker   . Smokeless tobacco: None  . Alcohol Use: No   OB History    No data available     Review of Systems  Constitutional: Negative for fever and chills.  Musculoskeletal: Positive for arthralgias.  Skin: Positive for color change and wound (Abscess to right forearm).  All other systems reviewed and are negative.  Allergies  Review of patient's allergies indicates no known allergies.  Home Medications   Prior to Admission medications   Medication Sig Start Date End Date Taking? Authorizing Provider  azithromycin (ZITHROMAX) 250 MG tablet Two tablets on day one, then one tablet once daily for  four days 11/30/14   Kerri PerchesMargaret E Simpson, MD  benzonatate (TESSALON) 100 MG capsule Take 1 capsule (100 mg total) by mouth 2 (two) times daily as needed for cough. 11/30/14   Kerri PerchesMargaret E Simpson, MD  cephALEXin (KEFLEX) 500 MG capsule Take 1 capsule (500 mg total) by mouth 4 (four) times daily. Patient not taking: Reported on 11/30/2014 11/10/14   Bethann BerkshireJoseph Zammit, MD  mometasone (NASONEX) 50 MCG/ACT nasal spray Place 2 sprays into the nose daily. 11/30/14   Kerri PerchesMargaret E Simpson, MD  ondansetron (ZOFRAN ODT) 4 MG disintegrating tablet 4mg  ODT q4 hours prn nausea/vomit Patient not taking: Reported on 11/30/2014 11/10/14   Bethann BerkshireJoseph Zammit, MD   Triage VItals: BP 150/87 mmHg  Pulse 101  Temp(Src) 98.2 F (36.8 C) (Oral)  Resp 18  Ht 5\' 4"  (1.626 m)  Wt 115 lb (52.164 kg)  BMI 19.73 kg/m2  SpO2 100%   Physical Exam  Constitutional: She is oriented to person, place, and time. She appears well-developed and well-nourished. No distress.  HENT:  Head: Normocephalic and atraumatic.  Eyes: Conjunctivae and EOM are normal.  Neck: Neck supple. No tracheal deviation present.  Cardiovascular: Normal rate.   Pulmonary/Chest: Effort normal. No respiratory distress.  Musculoskeletal: Normal range of motion. She exhibits tenderness.  No palpable nodes of the bicep/tricep area 3 x 3 red raised abscess area of the dorsal forearm; No red streaks; Mild to moderate increased warmth; Tender to palpation Full ROM of all fingers on right hand No abscess and  no red streaks of the palmar surface of the right wrist   Neurological: She is alert and oriented to person, place, and time.  Skin: Skin is warm and dry.  Psychiatric: She has a normal mood and affect. Her behavior is normal.  Nursing note and vitals reviewed.   ED Course  Procedures (including critical care time)  DIAGNOSTIC STUDIES: Oxygen Saturation is 100% on RA, normal by my interpretation.    COORDINATION OF CARE: 6:52 PM-Discussed treatment plan which  includes rx antibiotics with pt at bedside and pt agreed to plan. Will bandage area so pt has protection. Advised pt to soak arm in epsom salts  bath. Discussed return precautions including red streaking and increased erythema.   Labs Review Labs Reviewed - No data to display  Imaging Review No results found.   EKG Interpretation None      MDM Patient has a small abscess of the right forearm. This area is getting larger, and warm to touch. There no red streaks appreciated. There is no significant temperature elevation. There no palpable nodes in the bicep tricep area. The patient is placed on doxycycline and asked to use warm Epsom salt baths to the arm. He will see his primary physician, or return to the emergency department if any changes, problems, or concerns.    Final diagnoses:  None    **I personally performed the services described in this documentation, which was scribed in my presence. The recorded information has been reviewed and is accurate.Ivery Quale, PA-C 07/31/15 2034  Lavera Guise, MD 08/05/15 [phone]

## 2015-07-29 NOTE — Discharge Instructions (Signed)
Please use warm tub soaks for 15 min daily until abscess resolved. Use doxycycline 2 times daily with food. Use tylenol and ibuprofen for mild pain . Use norco for more severe pain. Abscess An abscess (boil or furuncle) is an infected area on or under the skin. This area is filled with yellowish-white fluid (pus) and other material (debris). HOME CARE   Only take medicines as told by your doctor.  If you were given antibiotic medicine, take it as directed. Finish the medicine even if you start to feel better.  If gauze is used, follow your doctor's directions for changing the gauze.  To avoid spreading the infection:  Keep your abscess covered with a bandage.  Wash your hands well.  Do not share personal care items, towels, or whirlpools with others.  Avoid skin contact with others.  Keep your skin and clothes clean around the abscess.  Keep all doctor visits as told. GET HELP RIGHT AWAY IF:   You have more pain, puffiness (swelling), or redness in the wound site.  You have more fluid or blood coming from the wound site.  You have muscle aches, chills, or you feel sick.  You have a fever. MAKE SURE YOU:   Understand these instructions.  Will watch your condition.  Will get help right away if you are not doing well or get worse.   This information is not intended to replace advice given to you by your health care provider. Make sure you discuss any questions you have with your health care provider.   Document Released: 03/16/2008 Document Revised: 03/29/2012 Document Reviewed: 12/12/2011 Elsevier Interactive Patient Education [employer] Elsevier Inc.

## 2015-08-05 ENCOUNTER — Ambulatory Visit: Payer: BLUE CROSS/BLUE SHIELD | Admitting: Family Medicine

## 2016-06-15 ENCOUNTER — Encounter (HOSPITAL_COMMUNITY): Payer: Self-pay | Admitting: *Deleted

## 2016-06-15 ENCOUNTER — Emergency Department (HOSPITAL_COMMUNITY)
Admission: EM | Admit: 2016-06-15 | Discharge: 2016-06-15 | Disposition: A | Discharge status: Home / Self Care | Payer: BLUE CROSS/BLUE SHIELD | Attending: Emergency Medicine | Admitting: Emergency Medicine

## 2016-06-15 DIAGNOSIS — R1013 Epigastric pain: Secondary | ICD-10-CM

## 2016-06-15 LAB — COMPREHENSIVE METABOLIC PANEL
ALK PHOS: 73 U/L (ref 38–126)
ALT: 18 U/L (ref 14–54)
ANION GAP: 6 (ref 5–15)
AST: 22 U/L (ref 15–41)
Albumin: 4.5 g/dL (ref 3.5–5.0)
BILIRUBIN TOTAL: 0.2 mg/dL — AB (ref 0.3–1.2)
BUN: 10 mg/dL (ref 6–20)
CALCIUM: 9.8 mg/dL (ref 8.9–10.3)
CO2: 29 mmol/L (ref 22–32)
Chloride: 102 mmol/L (ref 101–111)
Creatinine, Ser: 0.86 mg/dL (ref 0.44–1.00)
GFR calc Af Amer: >60 mL/min (ref >60)
GFR calc non Af Amer: >60 mL/min (ref >60)
Glucose, Bld: 92 mg/dL (ref 65–99)
Potassium: 3.7 mmol/L (ref 3.5–5.1)
Sodium: 137 mmol/L (ref 135–145)
TOTAL PROTEIN: 7.7 g/dL (ref 6.5–8.1)

## 2016-06-15 LAB — URINALYSIS, ROUTINE W REFLEX MICROSCOPIC
Bilirubin Urine: NEGATIVE
Glucose, UA: NEGATIVE mg/dL
KETONES UR: NEGATIVE mg/dL
NITRITE: NEGATIVE
PROTEIN: NEGATIVE mg/dL
Specific Gravity, Urine: 1.026 (ref 1.005–1.030)
pH: 6 (ref 5.0–8.0)

## 2016-06-15 LAB — CBC
HCT: 42.5 % (ref 36.0–46.0)
HEMOGLOBIN: 13.6 g/dL (ref 12.0–15.0)
MCH: 28.5 pg (ref 26.0–34.0)
MCHC: 32 g/dL (ref 30.0–36.0)
MCV: 89.1 fL (ref 78.0–100.0)
Platelets: 295 10*3/uL (ref 150–400)
RBC: 4.77 MIL/uL (ref 3.87–5.11)
RDW: 12.7 % (ref 11.5–15.5)
WBC: 5 10*3/uL (ref 4.0–10.5)

## 2016-06-15 LAB — URINE MICROSCOPIC-ADD ON

## 2016-06-15 LAB — PREGNANCY, URINE: Preg Test, Ur: NEGATIVE

## 2016-06-15 LAB — LIPASE, BLOOD: Lipase: 27 U/L (ref 11–51)

## 2016-06-15 MED ORDER — GI COCKTAIL ~~LOC~~
30.0000 mL | Freq: Once | ORAL | Status: AC
  Administered 2016-06-15: 30 mL via ORAL
  Filled 2016-06-15: qty 30

## 2016-06-15 MED ORDER — PANTOPRAZOLE SODIUM 40 MG PO TBEC
40.0000 mg | DELAYED_RELEASE_TABLET | Freq: Every day | ORAL | Status: DC
  Administered 2016-06-15: 40 mg via ORAL
  Filled 2016-06-15: qty 1

## 2016-06-15 MED ORDER — OMEPRAZOLE 20 MG PO CPDR: DELAYED_RELEASE_CAPSULE | ORAL | 0 refills | Status: DC

## 2016-06-15 NOTE — ED Triage Notes (Signed)
The pt is c/o  abd pain for 2-3 days no n v or diarrhea  lmp irregular

## 2016-06-15 NOTE — ED Provider Notes (Signed)
[facility]-EMERGENCY DEPT Provider Note   CSN: [id number] Arrival date & time: 06/15/16  0216     History   Chief Complaint Chief Complaint  Patient presents with  . Abdominal Pain    HPI Madison Harper is a 25 y.o. female.  Patient presents with complaint of epigastric and LUQ abdominal pain that started 2-3 days ago. She denies nausea, vomiting or fever. She has tried taking Tagamet, Gas-Ex and TUMs with limited relief. The pain is intermittent without aggravating or alleviating factors. She denies significant alcohol use. No SOB, chest pain, vaginal symptoms, dysuria or back pain.    The history is provided by the patient. No language interpreter was used.    History reviewed. No pertinent past medical history.  Patient Active Problem List   Diagnosis Date Noted  . Acute pharyngitis 11/30/2014  . Allergic rhinitis 11/30/2014  . ACUTE BRONCHITIS 10/24/2009  . 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 No pertinent surgical history.  OB History    No data available       Home Medications    Prior to Admission medications   Medication Sig Start Date End Date Taking? Authorizing Provider  calcium carbonate (TUMS - DOSED IN MG ELEMENTAL CALCIUM) 500 MG chewable tablet Chew 1 tablet by mouth daily.   Yes Historical Provider, MD  cimetidine (TAGAMET) 200 MG tablet Take 100 mg by mouth 2 (two) times daily.   Yes Historical Provider, MD  Simethicone (GAS RELIEF 125 MAX ST PO) Take 1 tablet by mouth daily.   Yes Historical Provider, MD    Family History Family History  Problem Relation Age of Onset  . Hypertension Mother   . Hypertension Father   . Diabetes Father     Social History Social History  Substance Use Topics  . Smoking status: Never Smoker  . Smokeless tobacco: Never Used  . Alcohol use No     Allergies   Review of patient's allergies indicates no known allergies.   Review of Systems Review of Systems  Constitutional: Negative for appetite  change, chills and fever.  Respiratory: Negative.  Negative for shortness of breath.   Cardiovascular: Negative.  Negative for chest pain.  Gastrointestinal: Positive for abdominal pain. Negative for diarrhea, nausea and vomiting.  Genitourinary: Negative.  Negative for dysuria and vaginal discharge.  Musculoskeletal: Negative.  Negative for back pain and myalgias.  Neurological: Negative.      Physical Exam Updated Vital Signs BP 102/71   Pulse (!) 57   Temp 97.8 F (36.6 C) (Oral)   Resp 18   Ht 5\' 4"  (1.626 m)   Wt 52.2 kg   SpO2 98%   BMI 19.74 kg/m   Physical Exam  Constitutional: She appears well-developed and well-nourished. No distress.  HENT:  Head: Normocephalic.  Neck: Normal range of motion. Neck supple.  Cardiovascular: Normal rate and regular rhythm.   Pulmonary/Chest: Effort normal and breath sounds normal. She has no wheezes. She has no rales.  Abdominal: Soft. Bowel sounds are normal. She exhibits no distension. There is tenderness (Epigastric and LUQ tenderness. ). There is no rebound and no guarding.  Musculoskeletal: Normal range of motion.  Neurological: She is alert. No cranial nerve deficit.  Skin: Skin is warm and dry. No rash noted.  Psychiatric: She has a normal mood and affect.     ED Treatments / Results  Labs (all labs ordered are listed, but only abnormal results are displayed) Labs Reviewed  COMPREHENSIVE METABOLIC PANEL - Abnormal; Notable for  the following:       Result Value   Total Bilirubin 0.2 (*)    All other components within normal limits  URINALYSIS, ROUTINE W REFLEX MICROSCOPIC (NOT AT [facility]) - Abnormal; Notable for the following:    APPearance CLOUDY (*)    Hgb urine dipstick TRACE (*)    Leukocytes, UA MODERATE (*)    All other components within normal limits  URINE MICROSCOPIC-ADD ON - Abnormal; Notable for the following:    Squamous Epithelial / LPF 0-5 (*)    Bacteria, UA RARE (*)    All other components within normal  limits  LIPASE, BLOOD  CBC  PREGNANCY, URINE    EKG  EKG Interpretation None       Radiology No results found.  Procedures Procedures (including critical care time)  Medications Ordered in ED Medications  gi cocktail (Maalox,Lidocaine,Donnatal) (30 mLs Oral Given 06/15/16 0440)     Initial Impression / Assessment and Plan / ED Course  I have reviewed the triage vital signs and the nursing notes.  Pertinent labs & imaging results that were available during my care of the patient were reviewed by me and considered in my medical decision making (see chart for details).  Clinical Course    1. Dyspepsia  The patient has epigastric tenderness without other symptoms. Unrelated to food. No fever. Tagamet with some relief. No chest pain.  GI Cocktail with additional relief. Feel the symptoms are gastric. Will start on Prilosec and encourage PCP follow up.  Final Clinical Impressions(s) / ED Diagnoses   Final diagnoses:  None    New Prescriptions New Prescriptions   No medications on file     Elpidio Anis, PA-C 06/15/16 0544    Layla Maw Ward, DO 06/15/16 1610

## 2016-06-15 NOTE — Discharge Instructions (Signed)
YOU CAN TAKE TAGAMET TOGETHER WITH PRILOSEC IF NEEDED FOR PAIN CONTROL. RETURN HERE IF YOU DEVELOP A HIGH FEVER, SEVERE PAIN, VOMITING OR NEW CONCERN. OTHERWISE, FOLLOW UP WITH DR. Lodema HongSIMPSON FOR FURTHER EVALUATION AND MANAGEMENT IF PAIN CONTINUES.

## 2016-06-16 ENCOUNTER — Telehealth: Payer: Self-pay

## 2016-06-16 ENCOUNTER — Emergency Department (HOSPITAL_COMMUNITY)
Admission: EM | Admit: 2016-06-16 | Discharge: 2016-06-16 | Disposition: A | Discharge status: Home / Self Care | Payer: Self-pay | Attending: Emergency Medicine | Admitting: Emergency Medicine

## 2016-06-16 ENCOUNTER — Emergency Department (HOSPITAL_COMMUNITY): Payer: Self-pay

## 2016-06-16 ENCOUNTER — Encounter (HOSPITAL_COMMUNITY): Payer: Self-pay | Admitting: Emergency Medicine

## 2016-06-16 DIAGNOSIS — R1011 Right upper quadrant pain: Secondary | ICD-10-CM | POA: Insufficient documentation

## 2016-06-16 LAB — URINALYSIS, ROUTINE W REFLEX MICROSCOPIC
Bilirubin Urine: NEGATIVE
GLUCOSE, UA: NEGATIVE mg/dL
HGB URINE DIPSTICK: NEGATIVE
KETONES UR: NEGATIVE mg/dL
Nitrite: NEGATIVE
PH: 6.5 (ref 5.0–8.0)
Protein, ur: NEGATIVE mg/dL
Specific Gravity, Urine: 1.016 (ref 1.005–1.030)

## 2016-06-16 LAB — CBC
HEMATOCRIT: 37.7 % (ref 36.0–46.0)
Hemoglobin: 12.6 g/dL (ref 12.0–15.0)
MCH: 28.6 pg (ref 26.0–34.0)
MCHC: 33.4 g/dL (ref 30.0–36.0)
MCV: 85.7 fL (ref 78.0–100.0)
Platelets: 262 10*3/uL (ref 150–400)
RBC: 4.4 MIL/uL (ref 3.87–5.11)
RDW: 12.7 % (ref 11.5–15.5)
WBC: 5.7 10*3/uL (ref 4.0–10.5)

## 2016-06-16 LAB — COMPREHENSIVE METABOLIC PANEL
ALBUMIN: 4.7 g/dL (ref 3.5–5.0)
ALT: 17 U/L (ref 14–54)
AST: 21 U/L (ref 15–41)
Alkaline Phosphatase: 65 U/L (ref 38–126)
Anion gap: 6 (ref 5–15)
BUN: 16 mg/dL (ref 6–20)
CHLORIDE: 104 mmol/L (ref 101–111)
CO2: 28 mmol/L (ref 22–32)
CREATININE: 0.67 mg/dL (ref 0.44–1.00)
Calcium: 9.7 mg/dL (ref 8.9–10.3)
GFR calc Af Amer: >60 mL/min (ref >60)
Glucose, Bld: 86 mg/dL (ref 65–99)
POTASSIUM: 3.9 mmol/L (ref 3.5–5.1)
SODIUM: 138 mmol/L (ref 135–145)
Total Bilirubin: 0.3 mg/dL (ref 0.3–1.2)
Total Protein: 8 g/dL (ref 6.5–8.1)

## 2016-06-16 LAB — URINE MICROSCOPIC-ADD ON: RBC / HPF: NONE SEEN RBC/hpf (ref 0–5)

## 2016-06-16 LAB — I-STAT BETA HCG BLOOD, ED (MC, WL, AP ONLY): I-stat hCG, quantitative: <5 m[IU]/mL (ref <5)

## 2016-06-16 LAB — LIPASE, BLOOD: LIPASE: 32 U/L (ref 11–51)

## 2016-06-16 MED ORDER — POLYETHYLENE GLYCOL 3350 17 G PO PACK: 17.0000 g | PACK | Freq: Every day | ORAL | 0 refills | Status: DC

## 2016-06-16 MED ORDER — HYDROCODONE-ACETAMINOPHEN 5-325 MG PO TABS
1.0000 | ORAL_TABLET | Freq: Once | ORAL | Status: AC
  Administered 2016-06-16: 1 via ORAL
  Filled 2016-06-16: qty 1

## 2016-06-16 MED ORDER — TRAMADOL HCL 50 MG PO TABS: 50.0000 mg | ORAL_TABLET | Freq: Four times a day (QID) | ORAL | 0 refills | Status: DC | PRN

## 2016-06-16 NOTE — Discharge Instructions (Signed)
Please read attached information. If you experience any new or worsening signs or symptoms please return to the emergency room for evaluation. Please follow-up with your primary care provider or specialist as discussed. Please use medication prescribed only as directed and discontinue taking if you have any concerning signs or symptoms.   °

## 2016-06-16 NOTE — Telephone Encounter (Signed)
Work in this morning 

## 2016-06-16 NOTE — ED Triage Notes (Signed)
Pt states that she has had abdominal pain x 4 days w/ nausea. Dx with gas on Sunday but pain has continued. Alert and oriented.

## 2016-06-16 NOTE — Telephone Encounter (Signed)
Called mother back and there is no option to leave message.  Will try again.

## 2016-06-16 NOTE — ED Provider Notes (Signed)
[facility]-EMERGENCY DEPT Provider Note   CSN: [id number] Arrival date & time: 06/16/16  1600     History   Chief Complaint Chief Complaint  Patient presents with  . Abdominal Pain    HPI Madison Harper is a 25 y.o. female.  HPI   25 year old female presents today with complaints of abdominal pain. Patient reports symptoms started approximate 4 days ago and she reports is located in her right upper quadrant, describes it as "irritated", coming and going with no appreciable pattern. She denies any radiation of symptoms, notes that when pain, she feels nauseous. She denies any lower abdominal pain, reports her last bowel movement was 3 days ago( normal per patient) continues to pass gas, no surgical history. Patient denies any fever, trauma to the abdomen, drugs or alcohol. Patient reports using indigestion medication with no improvement in her symptoms. Patient was seen yesterday for the same, but continues to endorse pain. She denies any urinary complaints   History reviewed. No pertinent past medical history.  Patient Active Problem List   Diagnosis Date Noted  . Acute pharyngitis 11/30/2014  . Allergic rhinitis 11/30/2014  . ACUTE BRONCHITIS 10/24/2009  . 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 WISDOM TOOTH EXTRACTION      OB History    No data available       Home Medications    Prior to Admission medications   Medication Sig Start Date End Date Taking? Authorizing Provider  calcium carbonate (TUMS - DOSED IN MG ELEMENTAL CALCIUM) 500 MG chewable tablet Chew 1 tablet by mouth daily as needed for indigestion.    Yes Historical Provider, MD  cimetidine (TAGAMET) 200 MG tablet Take 100 mg by mouth 2 (two) times daily as needed (indigestion).    Yes Historical Provider, MD  omeprazole (PRILOSEC) 20 MG capsule One capsule twice daily for the next 3 days, then once daily after that. Patient taking differently: Take 20 mg by mouth daily.   06/15/16  Yes Shari Upstill, PA-C  Simethicone (GAS-X PO) Take 1 tablet by mouth daily as needed (indigestion).   Yes Historical Provider, MD  polyethylene glycol (MIRALAX) packet Take 17 g by mouth daily. 06/16/16   Eyvonne MechanicJeffrey Delores Thelen, PA-C  traMADol (ULTRAM) 50 MG tablet Take 1 tablet (50 mg total) by mouth every 6 (six) hours as needed. 06/16/16   Eyvonne MechanicJeffrey Filippa Yarbough, PA-C    Family History Family History  Problem Relation Age of Onset  . Hypertension Mother   . Hypertension Father   . Diabetes Father     Social History Social History  Substance Use Topics  . Smoking status: Never Smoker  . Smokeless tobacco: Never Used  . Alcohol use No     Allergies   Review of patient's allergies indicates no known allergies.   Review of Systems Review of Systems  All other systems reviewed and are negative.    Physical Exam Updated Vital Signs BP 113/81 (BP Location: Left Arm)   Pulse 74   Temp 97.7 F (36.5 C) (Oral)   Resp 20   Ht 5\' 4"  (1.626 m)   Wt 52.2 kg   SpO2 100%   BMI 19.74 kg/m   Physical Exam  Constitutional: She is oriented to person, place, and time. She appears well-developed and well-nourished.  HENT:  Head: Normocephalic and atraumatic.  Eyes: Conjunctivae are normal. Pupils are equal, round, and reactive to light. Right eye exhibits no discharge. Left eye exhibits no discharge. No scleral  icterus.  Neck: Normal range of motion. No JVD present. No tracheal deviation present.  Pulmonary/Chest: Effort normal. No stridor.  Abdominal:  Tenderness to right upper quadrant  Neurological: She is alert and oriented to person, place, and time. Coordination normal.  Psychiatric: She has a normal mood and affect. Her behavior is normal. Judgment and thought content normal.  Nursing note and vitals reviewed.    ED Treatments / Results  Labs (all labs ordered are listed, but only abnormal results are displayed) Labs Reviewed  URINALYSIS, ROUTINE W REFLEX MICROSCOPIC (NOT  AT [facility]) - Abnormal; Notable for the following:       Result Value   Leukocytes, UA MODERATE (*)    All other components within normal limits  URINE MICROSCOPIC-ADD ON - Abnormal; Notable for the following:    Squamous Epithelial / LPF 0-5 (*)    Bacteria, UA RARE (*)    All other components within normal limits  LIPASE, BLOOD  COMPREHENSIVE METABOLIC PANEL  CBC  I-STAT BETA HCG BLOOD, ED ([facility], [facility], [facility] ONLY)    EKG  EKG Interpretation None       Radiology US Abdomen Limited Ruq  Result Date: 06/16/2016 CLINICAL DATA:  Right upper quadrant abdominal pain over the past 4 days. Nausea. EXAM: US ABDOMEN LIMITED - RIGHT UPPER QUADRANT COMPARISON:  None. FINDINGS: Gallbladder: Gallbladder is contracted. Given the degree of contraction, no significant gallbladder wall thickening. No gallstones or sonographic Murphy sign identified. The contraction is attributed to a meal 2 hours prior to ultrasound. Common bile duct: Diameter: 2 mm Liver: No focal lesion identified. Within normal limits in parenchymal echogenicity. IMPRESSION: 1. Gallbladder contraction, attributable to a meal 2 hours prior to the sonographic exam. Otherwise normal. Electronically Signed   By: Gaylyn Rong M.D.   On: 06/16/2016 21:44    Procedures Procedures (including critical care time)  Medications Ordered in ED Medications  HYDROcodone-acetaminophen (NORCO/VICODIN) 5-325 MG per tablet 1 tablet (1 tablet Oral Given 06/16/16 2221)     Initial Impression / Assessment and Plan / ED Course  I have reviewed the triage vital signs and the nursing notes.  Pertinent labs & imaging results that were available during my care of the patient were reviewed by me and considered in my medical decision making (see chart for details).  Clinical Course     Final Clinical Impressions(s) / ED Diagnoses   Final diagnoses:  RUQ pain   Labs:  Imaging:  Consults:  Therapeutics:  Discharge Meds:    Assessment/Plan:  25 year old female presents today with complaints of abdominal pain. She was seen in the ED for the same yesterday, no significant findings. Suspicion for gallbladder pathology. Right upper quadrant ultrasound shows no signs of stones or cholecystitis, noted contraction of the gallbladder. Patient is afebrile nontoxic, she has reassuring laboratory analysis. Patient will need gastroenterology follow-up. Patient is instructed to increase fiber intake as she has off and on constipation. She is given strict return precautions, she verbalized understanding and agreement to today's plan.   New Prescriptions Discharge Medication List as of 06/16/2016 11:08 PM    START taking these medications   Details  traMADol (ULTRAM) 50 MG tablet Take 1 tablet (50 mg total) by mouth every 6 (six) hours as needed., Starting Tue 06/16/2016, Print    polyethylene glycol (MIRALAX) packet Take 17 g by mouth daily., Starting Tue 06/16/2016, Print         Eyvonne Mechanic, PA-C 06/17/16 1610    Benjiman Core, MD 06/17/16  1721  

## 2016-06-17 NOTE — Telephone Encounter (Signed)
Patient scheduled.

## 2016-06-19 ENCOUNTER — Ambulatory Visit (INDEPENDENT_AMBULATORY_CARE_PROVIDER_SITE_OTHER): Payer: BLUE CROSS/BLUE SHIELD | Admitting: Family Medicine

## 2016-06-19 ENCOUNTER — Encounter: Payer: Self-pay | Admitting: Family Medicine

## 2016-06-19 VITALS — BP 120/70 | HR 96 | Resp 18 | Ht 64.5 in | Wt 120.0 lb

## 2016-06-19 DIAGNOSIS — R1011 Right upper quadrant pain: Secondary | ICD-10-CM

## 2016-06-19 DIAGNOSIS — R8290 Unspecified abnormal findings in urine: Secondary | ICD-10-CM

## 2016-06-19 LAB — POCT URINALYSIS DIPSTICK
Bilirubin, UA: NEGATIVE
GLUCOSE UA: NEGATIVE
Ketones, UA: NEGATIVE
LEUKOCYTES UA: NEGATIVE
NITRITE UA: NEGATIVE
Protein, UA: NEGATIVE
Spec Grav, UA: 1.02
UROBILINOGEN UA: 0.2
pH, UA: 6

## 2016-06-19 NOTE — Patient Instructions (Addendum)
Drink plenty of water May stop the omeprazole Keep using the miralax powder I have scheduled another specific gallbladder test Return after testing     Biliary Colic Biliary colic is a pain in the upper abdomen. The pain:  Is usually felt on the right side of the abdomen, but it may also be felt in the center of the abdomen, just below the breastbone (sternum).  May spread back toward the right shoulder blade.  May be steady or irregular.  May be accompanied by nausea and vomiting. Most of the time, the pain goes away in 1-5 hours. After the most intense pain passes, the abdomen may continue to ache mildly for about 24 hours. Biliary colic is caused by a blockage in the bile duct. The bile duct is a pathway that carries bile--a liquid that helps to digest fats--from the gallbladder to the small intestine. Biliary colic usually occurs after eating, when the digestive system demands bile. The pain develops when muscle cells contract forcefully to try to move the blockage so that bile can get by. HOME CARE INSTRUCTIONS  Take medicines only as directed by your health care provider.  Drink enough fluid to keep your urine clear or pale yellow.  Avoid fatty, greasy, and fried foods. These kinds of foods increase your body's demand for bile.  Avoid any foods that make your pain worse.  Avoid overeating.  Avoid having a large meal after fasting. SEEK MEDICAL CARE IF:  You develop a fever.  Your pain gets worse.  You vomit.  You develop nausea that prevents you from eating and drinking. SEEK IMMEDIATE MEDICAL CARE IF:  You suddenly develop a fever and shaking chills.  You develop a yellowish discoloration (jaundice) of:  Skin.  Whites of the eyes.  Mucous membranes.  You have continuous or severe pain that is not relieved with medicines.  You have nausea and vomiting that is not relieved with medicines.  You develop dizziness or you faint.

## 2016-06-19 NOTE — Progress Notes (Signed)
Chief Complaint  Patient presents with  . Abdominal Pain    ED visits x 2   pain x 7 days    Patient is here for emergency room follow-up. She has been seen in the emergency room twice in the last week. Dr. first visit she complained of upper abdominal pain. It was determined she likely had GERD and she was started on Tums and omeprazole. This did not help her pain. She continued to have severe recurring pain in her upper abdominal, right sided area. She states is worse after eating. She states it is associated with nausea but no vomiting. She states she's had some chills but no fever. She went back to the emergency room in 06/16/2016. Labs were repeated and were normal. Normal white blood count. She had an abdominal ultrasound this visit. The gallbladder was contracted but otherwise normal. She was discharged at this time with instructions to use MiraLAX daily, and to use tramadol when necessary pain. She has taken the tramadol one time and states it was effective. Patient has not had any problems with GI distress or acid reflux in the past. No abdominal problems. No trouble with bowels or digestion. She states her normal bowel pattern is to have bowel movements infrequently. She describes that she is going as long as a month without a bowel movement. She never feels constipated or uncomfortable. Her bowel movements are always normal. No blood in her bowels. Her last bowel movement was Sunday and was normal. The pain remains in the upper abdomen and right upper quadrant. It ranges from a "7-10" in intensity. No radiation. It happens from 30 minutes to 1 hour after eating fairly consistently. The type of food doesn't seem to matter.  On both of the urinalysis performed in the emergency department the patient had moderate leukocytes. She has not had any urinary symptoms. No burning or frequency. No history of bladder infections. Pregnancy test was negative. Last menstrual period was beginning of the  year. She has a history of amenorrhea.  Patient Active Problem List   Diagnosis Date Noted  . Allergic rhinitis 11/30/2014  . AMENORRHEA 07/03/2009    Outpatient Encounter Prescriptions as of 06/19/2016  Medication Sig  . calcium carbonate (TUMS - DOSED IN MG ELEMENTAL CALCIUM) 500 MG chewable tablet Chew 1 tablet by mouth daily as needed for indigestion.   . polyethylene glycol (MIRALAX) packet Take 17 g by mouth daily.  . traMADol (ULTRAM) 50 MG tablet Take 1 tablet (50 mg total) by mouth every 6 (six) hours as needed. (Patient not taking: Reported on 06/19/2016)   No facility-administered encounter medications on file as of 06/19/2016.    No Known Allergies  Review of Systems  Constitutional: Positive for appetite change and chills. Negative for activity change, fever and unexpected weight change.  HENT: Negative.   Eyes: Negative.   Respiratory: Negative.  Negative for cough and shortness of breath.   Cardiovascular: Negative for chest pain, palpitations and leg swelling.  Gastrointestinal: Positive for abdominal pain and nausea. Negative for abdominal distention, blood in stool, constipation, diarrhea and vomiting.  Genitourinary: Negative for difficulty urinating, dysuria, flank pain, frequency, menstrual problem and vaginal bleeding.  Musculoskeletal: Negative for back pain and myalgias.  Neurological: Negative for dizziness and headaches.  Hematological: Negative for adenopathy. Does not bruise/bleed easily.    BP 120/70   Pulse 96   Resp 18   Ht 5' 4.5" (1.638 m)   Wt 120 lb (54.4 kg)  SpO2 97%   BMI 20.28 kg/m   Physical Exam  Constitutional: She is oriented to person, place, and time. She appears well-developed and well-nourished.  HENT:  Head: Normocephalic and atraumatic.  Right Ear: External ear normal.  Left Ear: External ear normal.  Mouth/Throat: Oropharynx is clear and moist.  Eyes: Conjunctivae are normal. Pupils are equal, round, and reactive to light.    Neck: Normal range of motion. Neck supple. No thyromegaly present.  Cardiovascular: Normal rate, regular rhythm and normal heart sounds.   Pulmonary/Chest: Effort normal and breath sounds normal. No respiratory distress.  Abdominal: Soft. Normal appearance and bowel sounds are normal. She exhibits no distension and no mass. There is no hepatosplenomegaly. There is tenderness in the right upper quadrant and epigastric area. There is no rebound and no guarding. No hernia.  Musculoskeletal: Normal range of motion. She exhibits no edema.  Lymphadenopathy:    She has no cervical adenopathy.  Neurological: She is alert and oriented to person, place, and time.  Gait normal  Skin: Skin is warm and dry.  Psychiatric: She has a normal mood and affect. Her behavior is normal. Thought content normal.  Nursing note and vitals reviewed.    ASSESSMENT/PLAN:   1. Colicky RUQ abdominal pain  - NM Hepato W/Eject Fract; Future  2. Abnormal urine finding  - POCT Urinalysis Dipstick= NORMAL   Patient Instructions  Drink plenty of water May stop the omeprazole Keep using the miralax powder I have scheduled another specific gallbladder test Return after testing     Biliary Colic Biliary colic is a pain in the upper abdomen. The pain:  Is usually felt on the right side of the abdomen, but it may also be felt in the center of the abdomen, just below the breastbone (sternum).  May spread back toward the right shoulder blade.  May be steady or irregular.  May be accompanied by nausea and vomiting. Most of the time, the pain goes away in 1-5 hours. After the most intense pain passes, the abdomen may continue to ache mildly for about 24 hours. Biliary colic is caused by a blockage in the bile duct. The bile duct is a pathway that carries bile--a liquid that helps to digest fats--from the gallbladder to the small intestine. Biliary colic usually occurs after eating, when the digestive system  demands bile. The pain develops when muscle cells contract forcefully to try to move the blockage so that bile can get by. HOME CARE INSTRUCTIONS  Take medicines only as directed by your health care provider.  Drink enough fluid to keep your urine clear or pale yellow.  Avoid fatty, greasy, and fried foods. These kinds of foods increase your body's demand for bile.  Avoid any foods that make your pain worse.  Avoid overeating.  Avoid having a large meal after fasting. SEEK MEDICAL CARE IF:  You develop a fever.  Your pain gets worse.  You vomit.  You develop nausea that prevents you from eating and drinking. SEEK IMMEDIATE MEDICAL CARE IF:  You suddenly develop a fever and shaking chills.  You develop a yellowish discoloration (jaundice) of:  Skin.  Whites of the eyes.  Mucous membranes.  You have continuous or severe pain that is not relieved with medicines.  You have nausea and vomiting that is not relieved with medicines.  You develop dizziness or you faint.      Eustace MooreYvonne Sue Cantrell Larouche, MD

## 2016-06-25 ENCOUNTER — Encounter (HOSPITAL_COMMUNITY): Payer: BLUE CROSS/BLUE SHIELD

## 2017-06-16 ENCOUNTER — Encounter (HOSPITAL_COMMUNITY): Payer: Self-pay | Admitting: Emergency Medicine

## 2017-06-16 ENCOUNTER — Emergency Department (HOSPITAL_COMMUNITY)
Admission: EM | Admit: 2017-06-16 | Discharge: 2017-06-16 | Disposition: A | Discharge status: Home / Self Care | Payer: No Typology Code available for payment source | Attending: Emergency Medicine | Admitting: Emergency Medicine

## 2017-06-16 ENCOUNTER — Emergency Department (HOSPITAL_COMMUNITY): Payer: No Typology Code available for payment source

## 2017-06-16 DIAGNOSIS — R51 Headache: Secondary | ICD-10-CM | POA: Insufficient documentation

## 2017-06-16 DIAGNOSIS — Y9389 Activity, other specified: Secondary | ICD-10-CM | POA: Diagnosis not present

## 2017-06-16 DIAGNOSIS — S161XXA Strain of muscle, fascia and tendon at neck level, initial encounter: Secondary | ICD-10-CM | POA: Diagnosis not present

## 2017-06-16 DIAGNOSIS — S199XXA Unspecified injury of neck, initial encounter: Secondary | ICD-10-CM | POA: Diagnosis present

## 2017-06-16 DIAGNOSIS — Z79899 Other long term (current) drug therapy: Secondary | ICD-10-CM | POA: Diagnosis not present

## 2017-06-16 DIAGNOSIS — Y92488 Other paved roadways as the place of occurrence of the external cause: Secondary | ICD-10-CM | POA: Insufficient documentation

## 2017-06-16 DIAGNOSIS — Y998 Other external cause status: Secondary | ICD-10-CM | POA: Insufficient documentation

## 2017-06-16 MED ORDER — CYCLOBENZAPRINE HCL 10 MG PO TABS
10.0000 mg | ORAL_TABLET | Freq: Once | ORAL | Status: AC
  Administered 2017-06-16: 10 mg via ORAL
  Filled 2017-06-16: qty 1

## 2017-06-16 MED ORDER — IBUPROFEN 800 MG PO TABS: 800.0000 mg | ORAL_TABLET | Freq: Three times a day (TID) | ORAL | 0 refills | Status: DC

## 2017-06-16 MED ORDER — ACETAMINOPHEN 325 MG PO TABS
650.0000 mg | ORAL_TABLET | Freq: Once | ORAL | Status: AC
  Administered 2017-06-16: 650 mg via ORAL
  Filled 2017-06-16: qty 2

## 2017-06-16 MED ORDER — CYCLOBENZAPRINE HCL 10 MG PO TABS: 10.0000 mg | ORAL_TABLET | Freq: Three times a day (TID) | ORAL | 0 refills | Status: DC | PRN

## 2017-06-16 NOTE — ED Notes (Signed)
Pt alert & oriented x4, stable gait. Patient  given discharge instructions, paperwork & prescription(s). Patient verbalized understanding. Pt left department w/ no further questions. 
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
## 2017-06-16 NOTE — Discharge Instructions (Signed)
Apply ice packs on/off to your neck.  Follow-up with your primary doctor for recheck.  Return here for any worsening symptoms
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
## 2017-09-10 ENCOUNTER — Encounter (HOSPITAL_COMMUNITY): Payer: Self-pay

## 2017-09-10 ENCOUNTER — Other Ambulatory Visit: Payer: Self-pay

## 2017-09-10 ENCOUNTER — Emergency Department (HOSPITAL_COMMUNITY)
Admission: EM | Admit: 2017-09-10 | Discharge: 2017-09-10 | Disposition: A | Discharge status: Home / Self Care | Payer: Self-pay | Attending: Emergency Medicine | Admitting: Emergency Medicine

## 2017-09-10 DIAGNOSIS — L0291 Cutaneous abscess, unspecified: Secondary | ICD-10-CM

## 2017-09-10 DIAGNOSIS — Z3202 Encounter for pregnancy test, result negative: Secondary | ICD-10-CM | POA: Insufficient documentation

## 2017-09-10 DIAGNOSIS — L02211 Cutaneous abscess of abdominal wall: Secondary | ICD-10-CM | POA: Insufficient documentation

## 2017-09-10 LAB — POC URINE PREG, ED: Preg Test, Ur: NEGATIVE

## 2017-09-10 MED ORDER — LIDOCAINE HCL (PF) 1 % IJ SOLN
5.0000 mL | Freq: Once | INTRAMUSCULAR | Status: AC
  Administered 2017-09-10: 5 mL
  Filled 2017-09-10: qty 6

## 2017-09-10 MED ORDER — SULFAMETHOXAZOLE-TRIMETHOPRIM 800-160 MG PO TABS: 1.0000 | ORAL_TABLET | Freq: Two times a day (BID) | ORAL | 0 refills | Status: AC

## 2017-09-10 MED ORDER — TRAMADOL HCL 50 MG PO TABS: 50.0000 mg | ORAL_TABLET | Freq: Four times a day (QID) | ORAL | 0 refills | Status: DC | PRN

## 2017-09-10 NOTE — Discharge Instructions (Signed)
Continue warm soaks at least twice daily as discussed.  Complete the antibiotics. You may take the tramadol prescribed for pain relief.  This will make you drowsy - do not drive within 4 hours of taking this medication.

## 2017-09-10 NOTE — ED Triage Notes (Signed)
Pt states she shaved over a hair follicle in her pubic area and now has gotten more swollen and painful

## 2017-09-10 NOTE — ED Notes (Signed)
DC instructions received

## 2017-09-10 NOTE — ED Notes (Signed)
Urine obtained  Discharge is delayed until preg resulted to ascertain which antibiotic to Rx

## 2017-09-12 NOTE — ED Provider Notes (Signed)
[facility] EMERGENCY DEPARTMENT Provider Note   CSN: [id number] Arrival date & time: 09/10/17  1813     History   Chief Complaint Chief Complaint  Patient presents with  . Recurrent Skin Infections    HPI Madison Harper is a 26 y.o. female with no significant past medical history presenting with a one-week history of infection in her suprapubic area.  She describes developing a pimple at the site after shaving the area and over the past 3 days the site has become more painful and swollen.  There has been a small amount of purulent drainage from the site.  She has used warm compresses using Epsom salt water which may have helped it start to drain.  She has had no pain relievers and has found no alleviators of this pain.  She has no history of abscess prior to today.  The history is provided by the patient.    History reviewed. No pertinent past medical history.  Patient Active Problem List   Diagnosis Date Noted  . Allergic rhinitis 11/30/2014  . 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 WISDOM TOOTH EXTRACTION      OB History    No data available       Home Medications    Prior to Admission medications   Medication Sig Start Date End Date Taking? Authorizing Provider  calcium carbonate (TUMS - DOSED IN MG ELEMENTAL CALCIUM) 500 MG chewable tablet Chew 1 tablet by mouth daily as needed for indigestion.     [provider]  cyclobenzaprine (FLEXERIL) 10 MG tablet Take 1 tablet (10 mg total) by mouth 3 (three) times daily as needed. 06/16/17   Triplett, Tammy, PA-C  ibuprofen (ADVIL,MOTRIN) 800 MG tablet Take 1 tablet (800 mg total) by mouth 3 (three) times daily. 06/16/17   Triplett, Tammy, PA-C  polyethylene glycol (MIRALAX) packet Take 17 g by mouth daily. 06/16/16   Hedges, Tinnie GensJeffrey, PA-C  sulfamethoxazole-trimethoprim (BACTRIM DS,SEPTRA DS) 800-160 MG tablet Take 1 tablet by mouth 2 (two) times daily for 10 days. 09/10/17  09/20/17  Burgess AmorIdol, Nasser Ku, PA-C  traMADol (ULTRAM) 50 MG tablet Take 1 tablet (50 mg total) by mouth every 6 (six) hours as needed for moderate pain. 09/10/17   Burgess AmorIdol, Makinna Andy, PA-C    Family History Family History  Problem Relation Age of Onset  . Hypertension Mother   . Hypertension Father   . Diabetes Father     Social History Social History   Tobacco Use  . Smoking status: Never Smoker  . Smokeless tobacco: Never Used  Substance Use Topics  . Alcohol use: No  . Drug use: No     Allergies   Patient has no known allergies.   Review of Systems Review of Systems  Constitutional: Negative for chills and fever.  Respiratory: Negative for shortness of breath and wheezing.   Gastrointestinal: Negative.  Negative for nausea and vomiting.  Skin: Positive for wound.     Physical Exam Updated Vital Signs BP 116/78 (BP Location: Right Arm)   Pulse 100   Temp 99.5 F (37.5 C) (Oral)   Resp 18   Ht 5\' 4"  (1.626 m)   Wt 58.1 kg (128 lb)   SpO2 98%   BMI 21.97 kg/m   Physical Exam  Constitutional: She is oriented to person, place, and time. She appears well-developed and well-nourished.  HENT:  Head: Normocephalic and atraumatic.  Mouth/Throat: Oropharynx is clear and moist.  Neck: Normal range  of motion. No tracheal deviation present.  Cardiovascular: Normal rate and regular rhythm.  Pulmonary/Chest: Effort normal.  Abdominal: Soft. She exhibits no distension.  No seatbelt marks  Musculoskeletal: Normal range of motion. She exhibits tenderness.  Lymphadenopathy:    She has no cervical adenopathy.  Neurological: She is alert and oriented to person, place, and time. She displays normal reflexes. She exhibits normal muscle tone.  Skin: Skin is warm and dry. There is erythema.  2 cm slightly raised area of induration and thick adherent green exudate at the site of tenting.  There is a 1 cm surrounding area of erythema.  There is no red streaking.  There is no inguinal  adenopathy.  Psychiatric: She has a normal mood and affect.     ED Treatments / Results  Labs (all labs ordered are listed, but only abnormal results are displayed) Labs Reviewed  POC URINE PREG, ED    EKG  EKG Interpretation None       Radiology No results found.  Procedures Procedures (including critical care time)  INCISION AND DRAINAGE Performed by: Burgess AmorIDOL, Jeneen Doutt Consent: Verbal consent obtained. Risks and benefits: risks, benefits and alternatives were discussed Type: abscess  Body area: Suprapubic  Anesthesia: local infiltration  Incision was made with a scalpel.  Local anesthetic: lidocaine 1 % without epinephrine  Anesthetic total: 3 ml  Complexity: complex Blunt dissection to break up loculations  Drainage: purulent  Drainage amount: Moderate  Packing material: Wound was not packed. Patient tolerance: Patient tolerated the procedure well with no immediate complications.     Medications Ordered in ED Medications  lidocaine (PF) (XYLOCAINE) 1 % injection 5 mL (5 mLs Other Handoff 09/10/17 2110)     Initial Impression / Assessment and Plan / ED Course  I have reviewed the triage vital signs and the nursing notes.  Pertinent labs & imaging results that were available during my care of the patient were reviewed by me and considered in my medical decision making (see chart for details).    Patient was advised to continue warm Epsom salt soaks at least twice daily.  She was placed on Bactrim, tramadol for pain.  She was advised to follow-up with her PCP for recheck if this site is not starting to improve over the next 48 hours, returning here if she is unable to be seen by her PCP and feels she needs to be reevaluated.  Final Clinical Impressions(s) / ED Diagnoses   Final diagnoses:  Abscess    ED Discharge Orders        Ordered    sulfamethoxazole-trimethoprim (BACTRIM DS,SEPTRA DS) 800-160 MG tablet  2 times daily     09/10/17 2124     traMADol (ULTRAM) 50 MG tablet  Every 6 hours PRN     09/10/17 2124       Burgess Amordol, Danyeal Akens, PA-C 09/12/17 2017    Samuel JesterMcManus, Kathleen, DO 09/13/17 2357

## 2017-11-20 ENCOUNTER — Encounter (HOSPITAL_COMMUNITY): Payer: Self-pay | Admitting: Emergency Medicine

## 2017-11-20 ENCOUNTER — Emergency Department (HOSPITAL_COMMUNITY)
Admission: EM | Admit: 2017-11-20 | Discharge: 2017-11-20 | Disposition: A | Discharge status: Home / Self Care | Payer: Self-pay | Attending: Emergency Medicine | Admitting: Emergency Medicine

## 2017-11-20 DIAGNOSIS — J029 Acute pharyngitis, unspecified: Secondary | ICD-10-CM | POA: Insufficient documentation

## 2017-11-20 DIAGNOSIS — Z5321 Procedure and treatment not carried out due to patient leaving prior to being seen by health care provider: Secondary | ICD-10-CM | POA: Insufficient documentation

## 2017-11-20 LAB — RAPID STREP SCREEN (MED CTR MEBANE ONLY): Streptococcus, Group A Screen (Direct): NEGATIVE

## 2017-11-20 MED ORDER — ACETAMINOPHEN 325 MG PO TABS
650.0000 mg | ORAL_TABLET | Freq: Once | ORAL | Status: AC | PRN
  Administered 2017-11-20: 650 mg via ORAL
  Filled 2017-11-20: qty 2

## 2017-11-20 NOTE — ED Triage Notes (Signed)
PT called from front lobby no answer 

## 2017-11-20 NOTE — ED Triage Notes (Signed)
Reports sore throat with difficulty swallowing also c/o pain in front of neck.  Taking tylenol for pain.  Reports little relief.

## 2017-11-21 ENCOUNTER — Other Ambulatory Visit: Payer: Self-pay

## 2017-11-21 ENCOUNTER — Encounter (HOSPITAL_COMMUNITY): Payer: Self-pay | Admitting: Emergency Medicine

## 2017-11-21 ENCOUNTER — Emergency Department (HOSPITAL_COMMUNITY)
Admission: EM | Admit: 2017-11-21 | Discharge: 2017-11-21 | Disposition: A | Discharge status: Home / Self Care | Payer: Self-pay | Attending: Emergency Medicine | Admitting: Emergency Medicine

## 2017-11-21 DIAGNOSIS — J020 Streptococcal pharyngitis: Secondary | ICD-10-CM | POA: Insufficient documentation

## 2017-11-21 LAB — RAPID STREP SCREEN (MED CTR MEBANE ONLY): Streptococcus, Group A Screen (Direct): POSITIVE — AB

## 2017-11-21 MED ORDER — MAGIC MOUTHWASH W/LIDOCAINE: 5.0000 mL | Freq: Three times a day (TID) | ORAL | 0 refills | Status: DC | PRN

## 2017-11-21 MED ORDER — AMOXICILLIN 500 MG PO CAPS: 1000.0000 mg | ORAL_CAPSULE | Freq: Every day | ORAL | 0 refills | Status: DC

## 2017-11-21 MED ORDER — AMOXICILLIN 250 MG PO CAPS
1000.0000 mg | ORAL_CAPSULE | Freq: Once | ORAL | Status: AC
  Administered 2017-11-21: 1000 mg via ORAL
  Filled 2017-11-21: qty 4

## 2017-11-21 NOTE — ED Notes (Signed)
ST for the last three days unrelieved by OTC meds  Pt speaks with a clear voice   Throat slightly reddened

## 2017-11-21 NOTE — Discharge Instructions (Signed)
Alternate Tylenol and ibuprofen every 4-6 hours for fever and/or body aches.  Plenty of fluids.  Follow-up with your primary doctor or return here for any worsening symptoms

## 2017-11-21 NOTE — ED Triage Notes (Signed)
Pt c/o sore throat and congestion X3 days, no relief with OTC medicines.

## 2017-11-21 NOTE — ED Provider Notes (Signed)
[facility] EMERGENCY DEPARTMENT Provider Note   CSN: [id number] Arrival date & time: 11/21/17  1005     History   Chief Complaint Chief Complaint  Patient presents with  . Sore Throat    HPI Madison Harper is a 27 y.o. female.  HPI   Madison Harper is a 27 y.o. female who presents to the Emergency Department complaining of sore throat, generalized body aches, fever and chills.  Symptoms present for 3 days.  She describes a constant pain to her throat that is worse with swallowing.  She is tried over-the-counter cough and cold medications without relief.  She denies known sick contacts.  She also denies nasal congestion, cough, abdominal pain, vomiting, headache or neck pain and stiffness.   History reviewed. No pertinent past medical history.  Patient Active Problem List   Diagnosis Date Noted  . Allergic rhinitis 11/30/2014  . 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 WISDOM TOOTH EXTRACTION      OB History    No data available       Home Medications    Prior to Admission medications   Medication Sig Start Date End Date Taking? Authorizing Provider  calcium carbonate (TUMS - DOSED IN MG ELEMENTAL CALCIUM) 500 MG chewable tablet Chew 1 tablet by mouth daily as needed for indigestion.     [provider]  cyclobenzaprine (FLEXERIL) 10 MG tablet Take 1 tablet (10 mg total) by mouth 3 (three) times daily as needed. 06/16/17   Chais Fehringer, PA-C  ibuprofen (ADVIL,MOTRIN) 800 MG tablet Take 1 tablet (800 mg total) by mouth 3 (three) times daily. 06/16/17   Dawnyel Leven, PA-C  polyethylene glycol (MIRALAX) packet Take 17 g by mouth daily. 06/16/16   Hedges, Tinnie Gens, PA-C  traMADol (ULTRAM) 50 MG tablet Take 1 tablet (50 mg total) by mouth every 6 (six) hours as needed for moderate pain. 09/10/17   Burgess Amor, PA-C    Family History Family History  Problem Relation Age of Onset  . Hypertension Mother   .  Hypertension Father   . Diabetes Father     Social History Social History   Tobacco Use  . Smoking status: Never Smoker  . Smokeless tobacco: Never Used  Substance Use Topics  . Alcohol use: Yes    Comment: occasionallly  . Drug use: No     Allergies   Patient has no known allergies.   Review of Systems Review of Systems  Constitutional: Positive for chills and fever. Negative for activity change and appetite change.  HENT: Positive for sore throat and trouble swallowing. Negative for congestion, ear pain, facial swelling and voice change.   Eyes: Negative for pain and visual disturbance.  Respiratory: Negative for cough and shortness of breath.   Gastrointestinal: Negative for abdominal pain, nausea and vomiting.  Genitourinary: Negative for dysuria.  Musculoskeletal: Negative for arthralgias, neck pain and neck stiffness.  Skin: Negative for color change and rash.  Neurological: Negative for dizziness, facial asymmetry, speech difficulty and headaches.  Hematological: Negative for adenopathy.  All other systems reviewed and are negative.    Physical Exam Updated Vital Signs BP (!) 141/92 (BP Location: Left Arm)   Pulse (!) 101   Temp 99.2 F (37.3 C) (Oral)   Resp 16   Ht 5\' 4"  (1.626 m)   Wt 59 kg (130 lb)   LMP  (LMP Unknown)   SpO2 99%   BMI 22.31 kg/m   Physical Exam  Constitutional: She is oriented to person, place, and time. She appears well-developed and well-nourished. No distress.  HENT:  Head: Normocephalic and atraumatic.  Right Ear: Tympanic membrane and ear canal normal.  Left Ear: Tympanic membrane and ear canal normal.  Mouth/Throat: Uvula is midline and mucous membranes are normal. No trismus in the jaw. No uvula swelling. Posterior oropharyngeal edema and posterior oropharyngeal erythema present. No oropharyngeal exudate or tonsillar abscesses. Tonsils are 2+ on the right. Tonsils are 2+ on the left. No tonsillar exudate.  Uvula is midline  and nonedematous.  Patent.  Airway is patent.  No edema of the oropharynx.  Neck: Normal range of motion. Neck supple.  Cardiovascular: Normal rate, regular rhythm and normal heart sounds.  Pulmonary/Chest: Effort normal and breath sounds normal.  Abdominal: Soft. She exhibits no distension. There is no splenomegaly. There is no tenderness.  Musculoskeletal: Normal range of motion.  Lymphadenopathy:    She has cervical adenopathy.  Neurological: She is alert and oriented to person, place, and time. She exhibits normal muscle tone. Coordination normal.  Skin: Skin is warm and dry.  Nursing note and vitals reviewed.    ED Treatments / Results  Labs (all labs ordered are listed, but only abnormal results are displayed) Labs Reviewed  RAPID STREP SCREEN (NOT AT [facility]) - Abnormal; Notable for the following components:      Result Value   Streptococcus, Group A Screen (Direct) POSITIVE (*)    All other components within normal limits    EKG  EKG Interpretation None       Radiology No results found.  Procedures Procedures (including critical care time)  Medications Ordered in ED Medications  amoxicillin (AMOXIL) capsule 1,000 mg (not administered)     Initial Impression / Assessment and Plan / ED Course  I have reviewed the triage vital signs and the nursing notes.  Pertinent labs & imaging results that were available during my care of the patient were reviewed by me and considered in my medical decision making (see chart for details).     Strep screen positive.  Patient is otherwise well-appearing.  Handling secretions without difficulty.  No concerning symptoms for peritonsillar abscess.  She appears safe for discharge home will treat with Amoxil and Magic mouthwash.  Final Clinical Impressions(s) / ED Diagnoses   Final diagnoses:  Strep pharyngitis    ED Discharge Orders    None       Pauline Ausriplett, Shaindy Reader, PA-C 11/21/17 1217    Samuel JesterMcManus, Kathleen, DO 11/22/17  [phone]

## 2017-11-22 LAB — CULTURE, GROUP A STREP (THRC)

## 2018-10-04 ENCOUNTER — Encounter (HOSPITAL_COMMUNITY): Payer: Self-pay | Admitting: Emergency Medicine

## 2018-10-04 ENCOUNTER — Emergency Department (HOSPITAL_COMMUNITY)
Admission: EM | Admit: 2018-10-04 | Discharge: 2018-10-04 | Disposition: A | Discharge status: Home / Self Care | Payer: Self-pay | Attending: Emergency Medicine | Admitting: Emergency Medicine

## 2018-10-04 ENCOUNTER — Other Ambulatory Visit: Payer: Self-pay

## 2018-10-04 DIAGNOSIS — R509 Fever, unspecified: Secondary | ICD-10-CM

## 2018-10-04 DIAGNOSIS — N390 Urinary tract infection, site not specified: Secondary | ICD-10-CM | POA: Insufficient documentation

## 2018-10-04 LAB — URINALYSIS, ROUTINE W REFLEX MICROSCOPIC
BILIRUBIN URINE: NEGATIVE
Bacteria, UA: NONE SEEN
Bilirubin Urine: NEGATIVE
Glucose, UA: NEGATIVE mg/dL
Glucose, UA: NEGATIVE mg/dL
Ketones, ur: 5 mg/dL — AB
Ketones, ur: 20 mg/dL — AB
Leukocytes, UA: NEGATIVE
Leukocytes, UA: NEGATIVE
Nitrite: NEGATIVE
Nitrite: NEGATIVE
Protein, ur: NEGATIVE mg/dL
Protein, ur: NEGATIVE mg/dL
Specific Gravity, Urine: 1.028 (ref 1.005–1.030)
Specific Gravity, Urine: 1.028 (ref 1.005–1.030)
pH: 6 (ref 5.0–8.0)
pH: 5 (ref 5.0–8.0)

## 2018-10-04 LAB — CBC
HEMATOCRIT: 44.2 % (ref 36.0–46.0)
Hemoglobin: 14.1 g/dL (ref 12.0–15.0)
MCH: 28.4 pg (ref 26.0–34.0)
MCHC: 31.9 g/dL (ref 30.0–36.0)
MCV: 89.1 fL (ref 80.0–100.0)
Platelets: 259 10*3/uL (ref 150–400)
RBC: 4.96 MIL/uL (ref 3.87–5.11)
RDW: 12.2 % (ref 11.5–15.5)
WBC: 4.8 10*3/uL (ref 4.0–10.5)
nRBC: 0 % (ref 0.0–0.2)

## 2018-10-04 LAB — COMPREHENSIVE METABOLIC PANEL
ALT: 24 U/L (ref 0–44)
AST: 24 U/L (ref 15–41)
Albumin: 4 g/dL (ref 3.5–5.0)
Alkaline Phosphatase: 59 U/L (ref 38–126)
Anion gap: 10 (ref 5–15)
BUN: 11 mg/dL (ref 6–20)
CHLORIDE: 100 mmol/L (ref 98–111)
CO2: 25 mmol/L (ref 22–32)
CREATININE: 0.91 mg/dL (ref 0.44–1.00)
Calcium: 9.4 mg/dL (ref 8.9–10.3)
GFR calc Af Amer: >60 mL/min (ref >60)
GFR calc non Af Amer: >60 mL/min (ref >60)
Glucose, Bld: 110 mg/dL — ABNORMAL HIGH (ref 70–99)
Potassium: 3.8 mmol/L (ref 3.5–5.1)
Sodium: 135 mmol/L (ref 135–145)
Total Bilirubin: 0.6 mg/dL (ref 0.3–1.2)
Total Protein: 7.4 g/dL (ref 6.5–8.1)

## 2018-10-04 LAB — I-STAT BETA HCG BLOOD, ED (MC, WL, AP ONLY): I-stat hCG, quantitative: <5 m[IU]/mL (ref <5)

## 2018-10-04 LAB — LIPASE, BLOOD: Lipase: 30 U/L (ref 11–51)

## 2018-10-04 MED ORDER — CEPHALEXIN 500 MG PO CAPS: 1000.0000 mg | ORAL_CAPSULE | Freq: Two times a day (BID) | ORAL | 0 refills | Status: DC

## 2018-10-04 MED ORDER — IBUPROFEN 800 MG PO TABS
800.0000 mg | ORAL_TABLET | Freq: Once | ORAL | Status: AC
  Administered 2018-10-04: 800 mg via ORAL
  Filled 2018-10-04: qty 1

## 2018-10-04 MED ORDER — IBUPROFEN 600 MG PO TABS: 600.0000 mg | ORAL_TABLET | Freq: Four times a day (QID) | ORAL | 0 refills | Status: DC | PRN

## 2018-10-04 NOTE — Discharge Instructions (Addendum)
1.  Your urine has some blood in it.  With symptoms of chills and sweats, I suspect urinary tract infection.  You have been given a prescription of antibiotics to take.  Also, a urine culture is being done to see if bacteria grows from the specimen.  Your doctor needs to follow-up on these results, please let them know that you have been seen in the emergency department and schedule a follow-up appointment. 2.  It is also possible that you have a viral illness.  Take ibuprofen for body aches, fever or chills. 3.  Return to the emergency department if you are getting more sick, develop vomiting and cannot take your medication, increasing abdominal pain or other concerning symptoms.

## 2018-10-04 NOTE — ED Provider Notes (Signed)
[facility] EMERGENCY DEPARTMENT Provider Note   CSN: [id number] Arrival date & time: 10/04/18  0505     History   Chief Complaint Chief Complaint  Patient presents with  . Abdominal Pain  . Fever  . Diarrhea    HPI Madison Harper is a 27 y.o. female.  HPI Patient reports that yesterday she started to get some symptoms of chills and sweats.  She reports she had a fever but did not measure it.  She reports she had a couple of pains in her abdomen that were somewhat sharp.  She reports she had a small amount of diarrhea yesterday.  He did not develop any vomiting.  She has not had any ongoing diarrhea today.  He reports today she has a generalized headache, no diarrhea, some body aches and slight amount of abdominal discomfort.  No nasal congestion, no sore throat, no earache, no cough, no chest pain.  Patient reports she took some Tylenol yesterday for her symptoms.  She got mild relief.  Patient reports she has not been sexually active in over a year.  She denies vaginal discharge, pain burning urgency with urination.  Patient reports last menstrual cycle was a couple weeks ago and is a very short and small cycle. History reviewed. No pertinent past medical history.  Patient Active Problem List   Diagnosis Date Noted  . Allergic rhinitis 11/30/2014  . 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 WISDOM TOOTH EXTRACTION       OB History   No obstetric history on file.      Home Medications    Prior to Admission medications   Medication Sig Start Date End Date Taking? Authorizing Provider  amoxicillin (AMOXIL) 500 MG capsule Take 2 capsules (1,000 mg total) by mouth daily. For 10 days 11/21/17   Triplett, Tammy, PA-C  calcium carbonate (TUMS - DOSED IN MG ELEMENTAL CALCIUM) 500 MG chewable tablet Chew 1 tablet by mouth daily as needed for indigestion.     [provider]  cephALEXin (KEFLEX) 500 MG capsule  Take 2 capsules (1,000 mg total) by mouth 2 (two) times daily. 10/04/18   Arby Barrette, MD  cyclobenzaprine (FLEXERIL) 10 MG tablet Take 1 tablet (10 mg total) by mouth 3 (three) times daily as needed. 06/16/17   Triplett, Tammy, PA-C  ibuprofen (ADVIL,MOTRIN) 600 MG tablet Take 1 tablet (600 mg total) by mouth every 6 (six) hours as needed. 10/04/18   Arby Barrette, MD  ibuprofen (ADVIL,MOTRIN) 800 MG tablet Take 1 tablet (800 mg total) by mouth 3 (three) times daily. 06/16/17   Triplett, Tammy, PA-C  magic mouthwash w/lidocaine SOLN Take 5 mLs by mouth 3 (three) times daily as needed for mouth pain. Swish and spit, do not swallow 11/21/17   Triplett, Tammy, PA-C  polyethylene glycol (MIRALAX) packet Take 17 g by mouth daily. 06/16/16   Hedges, Tinnie Gens, PA-C  traMADol (ULTRAM) 50 MG tablet Take 1 tablet (50 mg total) by mouth every 6 (six) hours as needed for moderate pain. 09/10/17   Burgess Amor, PA-C    Family History Family History  Problem Relation Age of Onset  . Hypertension Mother   . Hypertension Father   . Diabetes Father     Social History Social History   Tobacco Use  . Smoking status: Never Smoker  . Smokeless tobacco: Never Used  Substance Use Topics  . Alcohol use: Yes    Comment: occasionallly  . Drug  use: No     Allergies   Patient has no known allergies.   Review of Systems Review of Systems 10 Systems reviewed and are negative for acute change except as noted in the HPI.   Physical Exam Updated Vital Signs BP 138/81 (BP Location: Right Arm)   Pulse (!) 101   Temp 99.9 F (37.7 C) (Oral)   Resp 18   LMP 09/27/2018   SpO2 99%   Physical Exam Constitutional:      Appearance: Normal appearance.  HENT:     Head: Normocephalic and atraumatic.     Nose: Nose normal.     Mouth/Throat:     Mouth: Mucous membranes are moist.     Pharynx: Oropharynx is clear.  Eyes:     Extraocular Movements: Extraocular movements intact.  Neck:     Musculoskeletal:  Normal range of motion and neck supple.  Cardiovascular:     Rate and Rhythm: Normal rate and regular rhythm.  Pulmonary:     Effort: Pulmonary effort is normal.     Breath sounds: Normal breath sounds.  Abdominal:     Comments: Abdomen is soft.  Mild discomfort to palpation in the lower quadrants.  No guarding.  No severe suprapubic tenderness.  No CVA tenderness.  Musculoskeletal: Normal range of motion.        General: No swelling or tenderness.     Right lower leg: No edema.     Left lower leg: No edema.  Skin:    General: Skin is warm and dry.  Neurological:     General: No focal deficit present.     Mental Status: She is alert and oriented to person, place, and time.     Cranial Nerves: No cranial nerve deficit.     Coordination: Coordination normal.  Psychiatric:        Mood and Affect: Mood normal.      ED Treatments / Results  Labs (all labs ordered are listed, but only abnormal results are displayed) Labs Reviewed  COMPREHENSIVE METABOLIC PANEL - Abnormal; Notable for the following components:      Result Value   Glucose, Bld 110 (*)    All other components within normal limits  URINALYSIS, ROUTINE W REFLEX MICROSCOPIC - Abnormal; Notable for the following components:   APPearance HAZY (*)    Hgb urine dipstick MODERATE (*)    Ketones, ur 5 (*)    All other components within normal limits  URINALYSIS, ROUTINE W REFLEX MICROSCOPIC - Abnormal; Notable for the following components:   APPearance HAZY (*)    Hgb urine dipstick MODERATE (*)    Ketones, ur 20 (*)    Bacteria, UA FEW (*)    All other components within normal limits  URINE CULTURE  LIPASE, BLOOD  CBC  I-STAT BETA HCG BLOOD, ED ([facility], [facility], [facility] ONLY)    EKG None  Radiology No results found.  Procedures Procedures (including critical care time)  Medications Ordered in ED Medications  ibuprofen (ADVIL,MOTRIN) tablet 800 mg (800 mg Oral Given 10/04/18 0805)     Initial Impression / Assessment  and Plan / ED Course  I have reviewed the triage vital signs and the nursing notes.  Pertinent labs & imaging results that were available during my care of the patient were reviewed by me and considered in my medical decision making (see chart for details).    Patient reports chills and sweats.  Patient had limited episode of diarrhea yesterday.  None ongoing.  No vomiting.  Today she has generalized headache and chills.  Patient's urinalysis is trace positive.  She is not having menstrual cycle and is not sexually active.  Based on symptoms we will treat with Keflex and obtain culture.  Patient may also be presenting with viral syndrome.  She has not had influenza shot this year.  Does not have any associated respiratory symptoms no URI symptoms.  Patient is nontoxic and stable for discharge.  Return precautions reviewed.  Final Clinical Impressions(s) / ED Diagnoses   Final diagnoses:  Lower urinary tract infectious disease  Fever, unspecified fever cause    ED Discharge Orders         Ordered    cephALEXin (KEFLEX) 500 MG capsule  2 times daily     10/04/18 0905    ibuprofen (ADVIL,MOTRIN) 600 MG tablet  Every 6 hours PRN     10/04/18 0905           Arby BarrettePfeiffer, Kashish Yglesias, MD 10/04/18 [phone]

## 2018-10-04 NOTE — ED Triage Notes (Signed)
C/o sharp generalized abd pain, fever, chills, diarrhea, and headache since Monday at 2am.  Denies nausea and vomiting.

## 2018-10-05 LAB — URINE CULTURE

## 2018-12-20 ENCOUNTER — Ambulatory Visit (HOSPITAL_COMMUNITY)
Admission: EM | Admit: 2018-12-20 | Discharge: 2018-12-20 | Disposition: A | Discharge status: Home / Self Care | Payer: Self-pay | Attending: Family Medicine | Admitting: Family Medicine

## 2018-12-20 ENCOUNTER — Encounter (HOSPITAL_COMMUNITY): Payer: Self-pay | Admitting: Family Medicine

## 2018-12-20 DIAGNOSIS — K602 Anal fissure, unspecified: Secondary | ICD-10-CM

## 2018-12-20 MED ORDER — FLUCONAZOLE 150 MG PO TABS: 150.0000 mg | ORAL_TABLET | Freq: Once | ORAL | 0 refills | Status: AC

## 2018-12-20 MED ORDER — HYDROCORTISONE 2.5 % RE CREA: TOPICAL_CREAM | RECTAL | 1 refills | Status: DC

## 2018-12-20 NOTE — Discharge Instructions (Addendum)
Member to rinse well after taking a shower so that soap does not stay near the bottom.

## 2018-12-20 NOTE — ED Triage Notes (Signed)
Pt c/o blood in stool x8 days

## 2018-12-20 NOTE — ED Provider Notes (Signed)
[facility]    CSN: [id number] Arrival date & time: 12/20/18  1630     History   Chief Complaint Chief Complaint  Patient presents with  . Rectal Bleeding    HPI Madison Harper is a 28 y.o. female.   This is a 28 year old woman who comes in complaining of rectal bleeding for the last 8 days.  This is her initial visit to [facility] urgent care.   She states this is painless.  She does have a problem with chronic constipation.  The blood is with a bright red or dark red.     History reviewed. No pertinent past medical history.  Patient Active Problem List   Diagnosis Date Noted  . Allergic rhinitis 11/30/2014  . 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 WISDOM TOOTH EXTRACTION      OB History   No obstetric history on file.      Home Medications    Prior to Admission medications   Medication Sig Start Date End Date Taking? Authorizing Provider  fluconazole (DIFLUCAN) 150 MG tablet Take 1 tablet (150 mg total) by mouth once for 1 dose. Repeat if needed 12/20/18 12/20/18  Elvina Sidle, MD  hydrocortisone (ANUSOL-HC) 2.5 % rectal cream Apply rectally 2 times daily 12/20/18   Elvina Sidle, MD    Family History Family History  Problem Relation Age of Onset  . Hypertension Mother   . Hypertension Father   . Diabetes Father     Social History Social History   Tobacco Use  . Smoking status: Never Smoker  . Smokeless tobacco: Never Used  Substance Use Topics  . Alcohol use: Yes    Comment: occasionallly  . Drug use: No     Allergies   Patient has no known allergies.   Review of Systems Review of Systems   Physical Exam Triage Vital Signs ED Triage Vitals  Enc Vitals Group     BP 12/20/18 1704 122/77     Pulse Rate 12/20/18 1704 92     Resp 12/20/18 1704 18     Temp 12/20/18 1704 99.1 F (37.3 C)     Temp Source 12/20/18 1704 Oral     SpO2 12/20/18 1704 100 %     Weight --    Height --      Head Circumference --      Peak Flow --      Pain Score 12/20/18 1705 0     Pain Loc --      Pain Edu? --      Excl. in GC? --    No data found.  Updated Vital Signs BP 122/77 (BP Location: Right Arm)   Pulse 92   Temp 99.1 F (37.3 C) (Oral)   Resp 18   SpO2 100%    Physical Exam Vitals signs and nursing note reviewed.  Constitutional:      Appearance: Normal appearance. She is normal weight.  Abdominal:     General: Bowel sounds are normal.     Palpations: There is no mass.     Tenderness: There is no abdominal tenderness.  Genitourinary:    Comments: Anal fissure just distal to the anal orifice. Skin:    General: Skin is warm.  Neurological:     General: No focal deficit present.     Mental Status: She is alert.  Psychiatric:        Mood and Affect: Mood normal.  UC Treatments / Results  Labs (all labs ordered are listed, but only abnormal results are displayed) Labs Reviewed - No data to display  EKG None  Radiology No results found.  Procedures Procedures (including critical care time)  Medications Ordered in UC Medications - No data to display  Initial Impression / Assessment and Plan / UC Course  I have reviewed the triage vital signs and the nursing notes.  Pertinent labs & imaging results that were available during my care of the patient were reviewed by me and considered in my medical decision making (see chart for details).    Final Clinical Impressions(s) / UC Diagnoses   Final diagnoses:  Anal fissure     Discharge Instructions     Member to rinse well after taking a shower so that soap does not stay near the bottom.    ED Prescriptions    Medication Sig Dispense Auth. Provider   hydrocortisone (ANUSOL-HC) 2.5 % rectal cream Apply rectally 2 times daily 28 g Elvina Sidle, MD   fluconazole (DIFLUCAN) 150 MG tablet Take 1 tablet (150 mg total) by mouth once for 1 dose. Repeat if needed 2 tablet  Elvina Sidle, MD     Controlled Substance Prescriptions [place] Controlled Substance Registry consulted? Not Applicable   Elvina Sidle, MD 12/20/18 1728
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
## 2020-09-22 ENCOUNTER — Emergency Department (HOSPITAL_COMMUNITY)
Admission: EM | Admit: 2020-09-22 | Discharge: 2020-09-23 | Disposition: A | Discharge status: Home / Self Care | Payer: Self-pay | Attending: Emergency Medicine | Admitting: Emergency Medicine

## 2020-09-22 ENCOUNTER — Other Ambulatory Visit: Payer: Self-pay

## 2020-09-22 ENCOUNTER — Encounter (HOSPITAL_COMMUNITY): Payer: Self-pay | Admitting: *Deleted

## 2020-09-22 DIAGNOSIS — Z5321 Procedure and treatment not carried out due to patient leaving prior to being seen by health care provider: Secondary | ICD-10-CM | POA: Insufficient documentation

## 2020-09-22 DIAGNOSIS — R220 Localized swelling, mass and lump, head: Secondary | ICD-10-CM | POA: Insufficient documentation
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
## 2020-09-22 NOTE — Care Management (Signed)
PCP assigned via [facility], patient will need to call and make a  Appointment.  Information added to patient instructions

## 2020-11-13 ENCOUNTER — Ambulatory Visit (INDEPENDENT_AMBULATORY_CARE_PROVIDER_SITE_OTHER): Payer: 59 | Admitting: Internal Medicine

## 2020-11-13 ENCOUNTER — Encounter: Payer: Self-pay | Admitting: Internal Medicine

## 2020-11-13 ENCOUNTER — Other Ambulatory Visit: Payer: Self-pay

## 2020-11-13 VITALS — BP 113/73 | HR 93 | Temp 99.4°F | Resp 18 | Ht 64.0 in | Wt 127.1 lb

## 2020-11-13 DIAGNOSIS — G43809 Other migraine, not intractable, without status migrainosus: Secondary | ICD-10-CM

## 2020-11-13 DIAGNOSIS — Z124 Encounter for screening for malignant neoplasm of cervix: Secondary | ICD-10-CM | POA: Diagnosis not present

## 2020-11-13 DIAGNOSIS — K1321 Leukoplakia of oral mucosa, including tongue: Secondary | ICD-10-CM | POA: Diagnosis not present

## 2020-11-13 DIAGNOSIS — G43909 Migraine, unspecified, not intractable, without status migrainosus: Secondary | ICD-10-CM | POA: Insufficient documentation

## 2020-11-13 DIAGNOSIS — Z7689 Persons encountering health services in other specified circumstances: Secondary | ICD-10-CM

## 2020-11-13 MED ORDER — SUMATRIPTAN SUCCINATE 50 MG PO TABS: 50.0000 mg | ORAL_TABLET | ORAL | 0 refills | Status: DC | PRN

## 2020-11-13 NOTE — Assessment & Plan Note (Signed)
Left sided headache, lasts for 2-3 hours, worsens with light and better with Ibuprofen Tylenol/Ibuprofen PRN for mild-moderate headache Sumatriptan PRN for severe headache If recurrent with multiple episodes, will start prophylactic treatment.

## 2020-11-13 NOTE — Progress Notes (Signed)
New Patient Office Visit  Subjective:  Patient ID: Madison Harper, female    DOB: April 07, 1991  Age: 30 y.o. MRN: [id number]  CC:  Chief Complaint  Patient presents with  . New Patient (Initial Visit)    New patient  has been having headaches also had a spot on her lip she went to urgent care last October with it but they told her she needed to see primary care     HPI Madison Harper is a 30 year old female with no significant PMH who presents for establishing care.  She c/o noticing an oral lesion after having hot food, which has persistent for a year now. It is white in color, underneath her lower lip and is not painful. She also has a habit to chew on it and thinks it could be provoking it. Denies any dysphagia or odynophagia. Denies noticing any patch on the tongue. Denies smoking or chewing tobacco. Denies any fatigue, low-grade fever or recent weight loss. She has an appointment with Dentist.  She also c/o intermittent headache, mostly on the left side, which lasts about 2-3 hours, worsens with light and relieves with Ibuprofen. She is not clear about overall frequency of headaches, but states that it is infrequent.  She has had COVID vaccine. Denies flu vaccine.  History reviewed. No pertinent past medical history.  Past Surgical History:  Procedure Laterality Date  . WISDOM TOOTH EXTRACTION      Family History  Problem Relation Age of Onset  . Hypertension Mother   . Hypertension Father   . Diabetes Father     Social History   Socioeconomic History  . Marital status: Single    Spouse name: Not on file  . Number of children: Not on file  . Years of education: Not on file  . Highest education level: Not on file  Occupational History  . Not on file  Tobacco Use  . Smoking status: Never Smoker  . Smokeless tobacco: Never Used  Substance and Sexual Activity  . Alcohol use: Yes    Comment: occasionallly  . Drug use: No  . Sexual activity: Never  Other  Topics Concern  . Not on file  Social History Narrative  . Not on file   Social Determinants of Health   Financial Resource Strain: Not on file  Food Insecurity: Not on file  Transportation Needs: Not on file  Physical Activity: Not on file  Stress: Not on file  Social Connections: Not on file  Intimate Partner Violence: Not on file    ROS Review of Systems  Constitutional: Negative for chills and fever.  HENT: Negative for congestion, sinus pressure, sinus pain and sore throat.        Lower lip lesion  Eyes: Negative for pain and discharge.  Respiratory: Negative for cough and shortness of breath.   Cardiovascular: Negative for chest pain and palpitations.  Gastrointestinal: Negative for abdominal pain, constipation, diarrhea, nausea and vomiting.  Endocrine: Negative for polydipsia and polyuria.  Genitourinary: Negative for dysuria and hematuria.  Musculoskeletal: Negative for neck pain and neck stiffness.  Skin: Negative for rash.  Neurological: Positive for headaches. Negative for dizziness, syncope, speech difficulty, weakness and numbness.  Psychiatric/Behavioral: Negative for agitation and behavioral problems.    Objective:   Today's Vitals: BP 113/73 (BP Location: Right Arm, Patient Position: Sitting, Cuff Size: Normal)   Pulse 93   Temp 99.4 F (37.4 C) (Oral)   Resp 18   Ht 5\' 4"  (1.626 m)  Wt 127 lb 1.9 oz (57.7 kg)   SpO2 97%   BMI 21.82 kg/m   Physical Exam Vitals reviewed.  Constitutional:      General: She is not in acute distress.    Appearance: She is not diaphoretic.  HENT:     Head: Normocephalic and atraumatic.     Nose: Nose normal.     Mouth/Throat:     Mouth: Mucous membranes are moist. Oral lesions (verrucous lesion, whitish - inner side of lower lip) present.     Tongue: No lesions.  Eyes:     General: No scleral icterus.    Extraocular Movements: Extraocular movements intact.     Pupils: Pupils are equal, round, and reactive to  light.  Cardiovascular:     Rate and Rhythm: Normal rate and regular rhythm.     Pulses: Normal pulses.     Heart sounds: No murmur heard.   Pulmonary:     Breath sounds: Normal breath sounds. No wheezing or rales.  Musculoskeletal:     Cervical back: Neck supple. No tenderness.     Right lower leg: No edema.     Left lower leg: No edema.  Skin:    General: Skin is warm.     Findings: No rash.  Neurological:     General: No focal deficit present.     Mental Status: She is alert and oriented to person, place, and time.     Sensory: No sensory deficit.     Motor: No weakness.  Psychiatric:        Mood and Affect: Mood normal.        Behavior: Behavior normal.      Assessment & Plan:   Problem List Items Addressed This Visit      Encounter to establish care - Primary   Care established Previous chart reviewed History and medications reviewed with the patient      Cardiovascular and Mediastinum   Migraine    Left sided headache, lasts for 2-3 hours, worsens with light and better with Ibuprofen Tylenol/Ibuprofen PRN for mild-moderate headache Sumatriptan PRN for severe headache If recurrent with multiple episodes, will start prophylactic treatment.      Relevant Medications   SUMAtriptan (IMITREX) 50 MG tablet     Digestive   Leukoplakia oral mucosa    Appears to be verrucous lesion, nontender Advised to follow up with Dentist/oral surgeon for possible biopsy Multivitamins        Other  Other Visit Diagnoses    Routine cervical smear       Relevant Orders   Ambulatory referral to Obstetrics / Gynecology      Outpatient Encounter Medications as of 11/13/2020  Medication Sig  . SUMAtriptan (IMITREX) 50 MG tablet Take 1 tablet (50 mg total) by mouth every 2 (two) hours as needed for migraine. May repeat in 2 hours if headache persists or recurs. Maximum 2 doses in a day.  . [DISCONTINUED] hydrocortisone (ANUSOL-HC) 2.5 % rectal cream Apply rectally 2 times  daily (Patient not taking: Reported on 11/13/2020)   No facility-administered encounter medications on file as of 11/13/2020.    Follow-up: Return in about 3 months (around 02/10/2021) for Oral lesion.   Anabel Halon, MD

## 2020-11-13 NOTE — Patient Instructions (Signed)
Please take Multivitamins once in a day.  Please follow up with your Dentist/Oral surgeon for evaluation and possible biopsy of the oral lesion.  Please take Tylenol for headache. If it is not resolved, please take Sumatriptan as prescribed, up to 2 doses in a day.

## 2020-11-13 NOTE — Assessment & Plan Note (Signed)
Appears to be verrucous lesion, nontender Advised to follow up with Dentist/oral surgeon for possible biopsy Multivitamins

## 2020-11-13 NOTE — Assessment & Plan Note (Signed)
Care established Previous chart reviewed History and medications reviewed with the patient 

## 2021-01-28 ENCOUNTER — Other Ambulatory Visit: Payer: Self-pay | Admitting: Internal Medicine

## 2021-01-28 DIAGNOSIS — G43809 Other migraine, not intractable, without status migrainosus: Secondary | ICD-10-CM

## 2021-02-12 ENCOUNTER — Ambulatory Visit: Payer: 59 | Admitting: Internal Medicine

## 2021-06-19 ENCOUNTER — Encounter: Payer: Self-pay | Admitting: Emergency Medicine

## 2021-06-19 ENCOUNTER — Other Ambulatory Visit: Payer: Self-pay

## 2021-06-19 ENCOUNTER — Ambulatory Visit
Admission: EM | Admit: 2021-06-19 | Discharge: 2021-06-19 | Disposition: A | Discharge status: Home / Self Care | Payer: 59 | Attending: Emergency Medicine | Admitting: Emergency Medicine

## 2021-06-19 DIAGNOSIS — K921 Melena: Secondary | ICD-10-CM | POA: Diagnosis not present

## 2021-06-19 MED ORDER — POLYETHYLENE GLYCOL 3350 17 G PO PACK: 17.0000 g | PACK | Freq: Every day | ORAL | 0 refills | Status: DC

## 2021-06-19 NOTE — ED Triage Notes (Signed)
States she see blood when she wipes after a bowel movement since Sunday.  States she has noticed blood in the toilet for the past 2 days,

## 2021-06-19 NOTE — ED Provider Notes (Signed)
[facility]   [id number] 06/19/21 Arrival Time: 1603  CC: Diarrhea  SUBJECTIVE:  Madison Harper is a 30 y.o. female who presents with complaint of BRB on stool x 1-2 days.  Denies a precipitating event, trauma.  Does admit to constipation, and some straining with Bms.  Denies pain.  Denies alleviating or aggravating factors.  Reports similar symptoms in the past and told it was from spicy foods.    Denies fever, chills, nausea, vomiting, chest pain, SOB, diarrhea, melena, dysuria, difficulty urinating, increased frequency or urgency, flank pain, loss of bowel or bladder function  Patient's last menstrual period was 06/02/2021 (approximate).  ROS: As per HPI.  All other pertinent ROS negative.     History reviewed. No pertinent past medical history. Past Surgical History:  Procedure Laterality Date   WISDOM TOOTH EXTRACTION     No Known Allergies No current facility-administered medications on file prior to encounter.   Current Outpatient Medications on File Prior to Encounter  Medication Sig Dispense Refill   SUMAtriptan (IMITREX) 50 MG tablet TAKE 1 TABLET BY MOUTH EVERY 2 HOURS AS NEEDED FOR MIGRAINE 10 tablet 0   Social History   Socioeconomic History   Marital status: Single    Spouse name: Not on file   Number of children: Not on file   Years of education: Not on file   Highest education level: Not on file  Occupational History   Not on file  Tobacco Use   Smoking status: Never   Smokeless tobacco: Never  Substance and Sexual Activity   Alcohol use: Yes    Comment: occasionallly   Drug use: No   Sexual activity: Never  Other Topics Concern   Not on file  Social History Narrative   Not on file   Social Determinants of Health   Financial Resource Strain: Not on file  Food Insecurity: Not on file  Transportation Needs: Not on file  Physical Activity: Not on file  Stress: Not on file  Social Connections: Not on file  Intimate Partner Violence:  Not on file   Family History  Problem Relation Age of Onset   Hypertension Mother    Hypertension Father    Diabetes Father      OBJECTIVE:  Vitals:   06/19/21 1610  BP: 128/77  Pulse: 83  Resp: 16  Temp: 98.8 F (37.1 C)  TempSrc: Oral  SpO2: 97%    General appearance: Alert; NAD HEENT: NCAT.  Oropharynx clear.  Lungs: clear to auscultation bilaterally without adventitious breath sounds Heart: regular rate and rhythm.  Abdomen: soft, non-distended; normal active bowel sounds; non-tender to light and deep palpation; nontender at McBurney's point; no guarding Rectal: declines Extremities: no edema; symmetrical with no gross deformities Skin: warm and dry Neurologic: normal gait Psychological: alert and cooperative; normal mood and affect  ASSESSMENT & PLAN:  1. Blood in stool     Meds ordered this encounter  Medications   polyethylene glycol (MIRALAX / GLYCOLAX) 17 g packet    Sig: Take 17 g by mouth daily.    Dispense:  14 each    Refill:  0    Order Specific Question:   Supervising Provider    Answer:   [name] [[id number]]   Symptoms sound consistent with internal hemorrhoids Recommend increasing water intake.  Drink at least half your body weight in ounces Increased fiber rich foods in diet (see hand-out) Miralax prescribed.  Take as directed and to completion Follow up with PCP  or GI for reevaluation and to ensure your symptoms are improving Return or go to the ED if you have any new or worsening symptoms such as increased abdominal pain, nausea, vomiting, if you go 3-4 days without bowel movement, chest pain, shortness of breath, abdomen feels hard or distended, etc...   Reviewed expectations re: course of current medical issues. Questions answered. Outlined signs and symptoms indicating need for more acute intervention. Patient verbalized understanding. After Visit Summary given.   [name], PA-C 06/19/21 1626

## 2021-06-19 NOTE — Discharge Instructions (Signed)
Symptoms sound consistent with internal hemorrhoids Recommend increasing water intake.  Drink at least half your body weight in ounces Increased fiber rich foods in diet (see hand-out) Miralax prescribed.  Take as directed and to completion Follow up with PCP or GI for reevaluation and to ensure your symptoms are improving Return or go to the ED if you have any new or worsening symptoms such as increased abdominal pain, nausea, vomiting, if you go 3-4 days without bowel movement, chest pain, shortness of breath, abdomen feels hard or distended, etc..Marland Kitchen

## 2022-05-27 ENCOUNTER — Other Ambulatory Visit: Payer: Self-pay

## 2022-05-27 ENCOUNTER — Encounter (HOSPITAL_BASED_OUTPATIENT_CLINIC_OR_DEPARTMENT_OTHER): Payer: Self-pay

## 2022-05-27 ENCOUNTER — Emergency Department (HOSPITAL_BASED_OUTPATIENT_CLINIC_OR_DEPARTMENT_OTHER)
Admission: EM | Admit: 2022-05-27 | Discharge: 2022-05-28 | Disposition: A | Discharge status: Home / Self Care | Payer: 59 | Attending: Emergency Medicine | Admitting: Emergency Medicine

## 2022-05-27 ENCOUNTER — Emergency Department (HOSPITAL_BASED_OUTPATIENT_CLINIC_OR_DEPARTMENT_OTHER): Payer: 59 | Admitting: Radiology

## 2022-05-27 DIAGNOSIS — W450XXA Nail entering through skin, initial encounter: Secondary | ICD-10-CM | POA: Diagnosis not present

## 2022-05-27 DIAGNOSIS — S91331A Puncture wound without foreign body, right foot, initial encounter: Secondary | ICD-10-CM | POA: Insufficient documentation

## 2022-05-27 DIAGNOSIS — S99921A Unspecified injury of right foot, initial encounter: Secondary | ICD-10-CM | POA: Diagnosis present

## 2022-05-27 DIAGNOSIS — Z23 Encounter for immunization: Secondary | ICD-10-CM | POA: Diagnosis not present

## 2022-05-27 MED ORDER — TETANUS-DIPHTH-ACELL PERTUSSIS 5-2.5-18.5 LF-MCG/0.5 IM SUSY
0.5000 mL | PREFILLED_SYRINGE | Freq: Once | INTRAMUSCULAR | Status: AC
  Administered 2022-05-28: 0.5 mL via INTRAMUSCULAR
  Filled 2022-05-27: qty 0.5

## 2022-05-27 NOTE — ED Provider Notes (Signed)
   [facility] Provider Note: Madison Dell, MD, FACEP  CSN: [id number] MRN: [id number] ARRIVAL: 05/27/22 at 1940 ROOM: DB009/DB009   CHIEF COMPLAINT  stepped on nail   HISTORY OF PRESENT ILLNESS  05/27/22 11:47 PM Madison Harper is a 31 y.o. female who stepped on a nail with her right foot (through the shoe) today about 2 PM.  She has a puncture wound to the lateral aspect of her right sole anteriorly.  There is pain (described as "sore") with ambulation which she rates as a 7 out of 10.  Her tetanus is not up-to-date.   History reviewed. No pertinent past medical history.  Past Surgical History:  Procedure Laterality Date   WISDOM TOOTH EXTRACTION      Family History  Problem Relation Age of Onset   Hypertension Mother    Hypertension Father    Diabetes Father     Social History   Tobacco Use   Smoking status: Never   Smokeless tobacco: Never  Vaping Use   Vaping Use: Never used  Substance Use Topics   Alcohol use: Yes    Comment: occasionallly   Drug use: No    Prior to Admission medications   Medication Sig Start Date End Date Taking? Authorizing Provider  polyethylene glycol (MIRALAX / GLYCOLAX) 17 g packet Take 17 g by mouth daily. 06/19/21   [place], [place], PA-C  SUMAtriptan (IMITREX) 50 MG tablet TAKE 1 TABLET BY MOUTH EVERY 2 HOURS AS NEEDED FOR MIGRAINE 01/28/21   Anabel Halon, MD    Allergies Patient has no known allergies.   REVIEW OF SYSTEMS  Negative except as noted here or in the History of Present Illness.   PHYSICAL EXAMINATION  Initial Vital Signs Blood pressure (!) 143/95, pulse 84, temperature 98.6 F (37 C), resp. rate 16, height 5\' 4"  (1.626 m), weight 56.7 kg, last menstrual period 03/16/2022, SpO2 100 %.  Examination General: Well-developed, well-nourished female in no acute distress; appearance consistent with age of record HENT: normocephalic; atraumatic Eyes: Normal appearance Neck: supple Heart: regular rate and  rhythm Lungs: clear to auscultation bilaterally Abdomen: soft; nondistended; nontender; bowel sounds present Extremities: No deformity; full range of motion; pulses normal; puncture wound to sole of lateral right foot with tenderness to palpation:    Neurologic: Awake, alert and oriented; motor function intact in all extremities and symmetric; no facial droop Skin: Warm and dry Psychiatric: Normal mood and affect   RESULTS  Summary of this visit's results, reviewed and interpreted by myself:   EKG Interpretation  Date/Time:    Ventricular Rate:    PR Interval:    QRS Duration:   QT Interval:    QTC Calculation:   R Axis:     Text Interpretation:         Laboratory Studies: No results found for this or any previous visit (from the past 24 hour(s)). Imaging Studies: No results found.  ED COURSE and MDM  Nursing notes, initial and subsequent vitals signs, including pulse oximetry, reviewed and interpreted by myself.  Vitals:   05/27/22 2009 05/27/22 2009  BP:  (!) 143/95  Pulse:  84  Resp:  16  Temp:  98.6 F (37 C)  SpO2:  100%  Weight: 56.7 kg   Height: 5\' 4"  (1.626 m)    Medications - No data to display    PROCEDURES  Procedures   ED DIAGNOSES  No diagnosis found.

## 2022-05-27 NOTE — ED Triage Notes (Signed)
Pt states she stepped on nail at work today.  Puncture wound, not open & no redness

## 2022-05-28 MED ORDER — IBUPROFEN 800 MG PO TABS
800.0000 mg | ORAL_TABLET | Freq: Once | ORAL | Status: AC
  Administered 2022-05-28: 800 mg via ORAL
  Filled 2022-05-28: qty 1

## 2022-05-28 MED ORDER — FLUCONAZOLE 150 MG PO TABS: ORAL_TABLET | ORAL | 0 refills | Status: DC

## 2022-05-28 MED ORDER — CEPHALEXIN 250 MG PO CAPS
1000.0000 mg | ORAL_CAPSULE | Freq: Once | ORAL | Status: AC
  Administered 2022-05-28: 1000 mg via ORAL
  Filled 2022-05-28: qty 4

## 2022-05-28 MED ORDER — CEPHALEXIN 500 MG PO CAPS: 500.0000 mg | ORAL_CAPSULE | Freq: Four times a day (QID) | ORAL | 0 refills | Status: DC

## 2022-05-28 NOTE — ED Notes (Signed)
Pt agreeable with d/c plan as discussed by provider- this nurse has verbally reinforced d/c instructions and provided pt with written copy- pt acknowledges verbal understanding and denies any additional questions concerns needs- declined w/c- ambulatory at d/c with steady gait; vitals stable; no distress.  

## 2022-09-30 ENCOUNTER — Emergency Department (HOSPITAL_COMMUNITY)
Admission: EM | Admit: 2022-09-30 | Discharge: 2022-09-30 | Disposition: A | Discharge status: Home / Self Care | Payer: 59 | Attending: Emergency Medicine | Admitting: Emergency Medicine

## 2022-09-30 ENCOUNTER — Encounter (HOSPITAL_COMMUNITY): Payer: Self-pay | Admitting: Emergency Medicine

## 2022-09-30 DIAGNOSIS — J029 Acute pharyngitis, unspecified: Secondary | ICD-10-CM | POA: Diagnosis not present

## 2022-09-30 DIAGNOSIS — Z1152 Encounter for screening for COVID-19: Secondary | ICD-10-CM | POA: Insufficient documentation

## 2022-09-30 LAB — RESP PANEL BY RT-PCR (RSV, FLU A&B, COVID)  RVPGX2
Influenza A by PCR: NEGATIVE
Influenza B by PCR: NEGATIVE
Resp Syncytial Virus by PCR: NEGATIVE
SARS Coronavirus 2 by RT PCR: NEGATIVE

## 2022-09-30 LAB — GROUP A STREP BY PCR: Group A Strep by PCR: NOT DETECTED

## 2022-09-30 MED ORDER — LIDOCAINE VISCOUS HCL 2 % MT SOLN: 15.0000 mL | OROMUCOSAL | 0 refills | Status: DC | PRN

## 2022-09-30 MED ORDER — LIDOCAINE VISCOUS HCL 2 % MT SOLN
15.0000 mL | Freq: Once | OROMUCOSAL | Status: AC
  Administered 2022-09-30: 15 mL via OROMUCOSAL
  Filled 2022-09-30: qty 15

## 2022-09-30 NOTE — ED Triage Notes (Signed)
Pt reports sore throat for 4 days.

## 2022-09-30 NOTE — ED Provider Triage Note (Signed)
Emergency Medicine Provider Triage Evaluation Note  Madison Harper , a 31 y.o. female  was evaluated in triage.  Pt complains of sore throat, cough x 3 days.  Pain with swallowing.  Denies feeling short of breath..  Review of Systems  Per HPI  Physical Exam  BP (!) 142/90 (BP Location: Right Arm)   Pulse 83   Temp 98 F (36.7 C)   Resp 16   Ht 5\' 4"  (1.626 m)   Wt 56 kg   SpO2 100%   BMI 21.19 kg/m  Gen:   Awake, no distress   Resp:  Normal effort  MSK:   Moves extremities without difficulty  Other:    Medical Decision Making  Medically screening exam initiated at 2:39 PM.  Appropriate orders placed.  was informed that the remainder of the evaluation will be completed by another provider, this initial triage assessment does not replace that evaluation, and the importance of remaining in the ED until their evaluation is complete.  Uvula midline, normal phonation.   Harlen Labs, PA-C 09/30/22 1439

## 2022-09-30 NOTE — ED Provider Notes (Signed)
[facility] EMERGENCY DEPARTMENT Provider Note   CSN: [id number] Arrival date & time: 09/30/22  1339     History  Chief Complaint  Patient presents with   Sore Throat   HPI Madison Harper is a 31 y.o. female presenting for 4 days ago. Denies fever, cough, SOB, and sick contacts. States her throat feels scratchy like "something is stuck in there". States that last night she was lying flat on her bad and had trouble breathing. She sat up and her WOB improved. Denies trouble swallowing, drooling, and neck pain. Patient states that she recently had recovered from a URI with similar sore throat symptoms last week.    Sore Throat       Home Medications Prior to Admission medications   Medication Sig Start Date End Date Taking? Authorizing Provider  cephALEXin (KEFLEX) 500 MG capsule Take 1 capsule (500 mg total) by mouth 4 (four) times daily. 05/28/22   Molpus, Nahdia Doucet, MD  fluconazole (DIFLUCAN) 150 MG tablet Take 1 tablet as needed for vaginal yeast infection.  May repeat in 3 days if symptoms persist. 05/28/22   Molpus, Jonny Ruiz, MD  polyethylene glycol (MIRALAX / GLYCOLAX) 17 g packet Take 17 g by mouth daily. 06/19/21   Wurst, [place], PA-C  SUMAtriptan (IMITREX) 50 MG tablet TAKE 1 TABLET BY MOUTH EVERY 2 HOURS AS NEEDED FOR MIGRAINE 01/28/21   Anabel Halon, MD      Allergies    Patient has no known allergies.    Review of Systems   Review of Systems  HENT:  Positive for sore throat.      Physical Exam   Vitals:   09/30/22 1429 09/30/22 1631  BP: (!) 142/90 126/84  Pulse: 83 85  Resp: 16 12  Temp: 98 F (36.7 C) 98.3 F (36.8 C)  SpO2: 100% 100%    CONSTITUTIONAL:  well-appearing, NAD NEURO:  Alert and oriented x 3, CN 3-12 grossly intact EYES:  eyes equal and reactive ENT/NECK:  Supple, no stridor, no crepitus, deformity or swelling of the neck. No cervical lymphadenopathy. No swelling, erythema, or exudate in the posterior pharynx. Uvula is  midline. CARDIO: regular rate and rhythm, appears well-perfused  PULM:  No respiratory distress GI/GU:  non-distended MSK/SPINE:  No gross deformities, no edema, moves all extremities  SKIN:  no rash, atraumatic   *Additional and/or pertinent findings included in MDM below    ED Results / Procedures / Treatments   Labs (all labs ordered are listed, but only abnormal results are displayed) Labs Reviewed  GROUP A STREP BY PCR  RESP PANEL BY RT-PCR (RSV, FLU A&B, COVID)  [id number]    EKG None  Radiology No results found.  Procedures Procedures    Medications Ordered in ED Medications  lidocaine (XYLOCAINE) 2 % viscous mouth solution 15 mL (has no administration in time range)    ED Course/ Medical Decision Making/ A&P                           Medical Decision Making  31 yo well appearing female presenting for sore throat. Physical exam was unremarkable and revealed grossly normal mouth, throat and neck. Lungs sounds were normal. Differential diagnosis includes ingested FB, strep, flu/covid, abscess. Doubt ingested FB given patient is still tolerating food and drink, not drooling and airway is patent. Doubt strep, flu and covid given negative pcr and patient is without infectious symptoms. Doubt abscess given no neck pain,  no peritonsillar deviation and patient is afebrile. Treated symptoms with viscous lidocaine. Advised patient to f/u with pcp if symptoms persist. Discussed return precautions.         Final Clinical Impression(s) / ED Diagnoses Final diagnoses:  Sore throat    Rx / DC Orders ED Discharge Orders     None         Harriet Pho, PA-C 09/30/22 1757    Drenda Freeze, MD 09/30/22 2222

## 2022-09-30 NOTE — Discharge Instructions (Signed)
Treating your sore throat with viscous lidocaine. Advise that you follow up with your PCP if symptoms persist. If you have shortness of breath, cannot swallow, or start drooling please return her for further evaluation.

## 2022-10-01 ENCOUNTER — Telehealth: Payer: Self-pay

## 2022-10-01 NOTE — Telephone Encounter (Signed)
Transition Care Management Unsuccessful Follow-up Telephone Call  Date of discharge and from where:  09/30/2022 from [facility]  Attempts:  1st Attempt  Reason for unsuccessful TCM follow-up call:  Unable to reach patient-Call can not be completed at this time.

## 2022-10-02 ENCOUNTER — Telehealth: Payer: Self-pay

## 2022-10-02 NOTE — Telephone Encounter (Signed)
Transition Care Management Unsuccessful Follow-up Telephone Call  Date of discharge and from where:  09/30/2022 from [facility]  Attempts:  2nd Attempt  Reason for unsuccessful TCM follow-up call:  Unable to reach patient.

## 2023-02-28 ENCOUNTER — Other Ambulatory Visit: Payer: Self-pay

## 2023-02-28 ENCOUNTER — Encounter (HOSPITAL_BASED_OUTPATIENT_CLINIC_OR_DEPARTMENT_OTHER): Payer: Self-pay | Admitting: Emergency Medicine

## 2023-02-28 ENCOUNTER — Emergency Department (HOSPITAL_BASED_OUTPATIENT_CLINIC_OR_DEPARTMENT_OTHER): Payer: Self-pay | Admitting: Radiology

## 2023-02-28 ENCOUNTER — Emergency Department (HOSPITAL_BASED_OUTPATIENT_CLINIC_OR_DEPARTMENT_OTHER)
Admission: EM | Admit: 2023-02-28 | Discharge: 2023-02-28 | Disposition: A | Discharge status: Home / Self Care | Payer: Self-pay | Attending: Emergency Medicine | Admitting: Emergency Medicine

## 2023-02-28 DIAGNOSIS — Y9361 Activity, american tackle football: Secondary | ICD-10-CM | POA: Insufficient documentation

## 2023-02-28 DIAGNOSIS — S76212A Strain of adductor muscle, fascia and tendon of left thigh, initial encounter: Secondary | ICD-10-CM | POA: Insufficient documentation

## 2023-02-28 DIAGNOSIS — W500XXA Accidental hit or strike by another person, initial encounter: Secondary | ICD-10-CM | POA: Insufficient documentation

## 2023-02-28 NOTE — ED Triage Notes (Signed)
Pt here form home with c/o left groin and left knee pain playing football yesterday , able to walk but hurts

## 2023-02-28 NOTE — ED Provider Notes (Signed)
[place] EMERGENCY DEPARTMENT AT [facility] Provider Note   CSN: [id number] Arrival date & time: 02/28/23  1750     History  Chief Complaint  Patient presents with   Groin Injury    Madison Harper is a 32 y.o. female.  Patient complains of pain in her left groin.  Patient reports that she was playing football yesterday and was tackled.  Patient reports that she has pain in right groin.  Patient heard a pop.  Patient denies any other area of injury pain radiates to her knee but she does not have any pain with moving her knee.  Patient denies any impact of her head no loss of consciousness  The history is provided by the patient. No language interpreter was used.       Home Medications Prior to Admission medications   Medication Sig Start Date End Date Taking? Authorizing Provider  cephALEXin (KEFLEX) 500 MG capsule Take 1 capsule (500 mg total) by mouth 4 (four) times daily. 05/28/22   Molpus, John, MD  fluconazole (DIFLUCAN) 150 MG tablet Take 1 tablet as needed for vaginal yeast infection.  May repeat in 3 days if symptoms persist. 05/28/22   Molpus, Jonny Ruiz, MD  lidocaine (XYLOCAINE) 2 % solution Use as directed 15 mLs in the mouth or throat as needed for mouth pain. 09/30/22   Gareth Eagle, PA-C  polyethylene glycol (MIRALAX / GLYCOLAX) 17 g packet Take 17 g by mouth daily. 06/19/21   Wurst, [place], PA-C  SUMAtriptan (IMITREX) 50 MG tablet TAKE 1 TABLET BY MOUTH EVERY 2 HOURS AS NEEDED FOR MIGRAINE 01/28/21   Anabel Halon, MD      Allergies    Patient has no known allergies.    Review of Systems   Review of Systems  All other systems reviewed and are negative.   Physical Exam Updated Vital Signs BP 139/87 (BP Location: Right Arm)   Pulse 73   Temp 98.5 F (36.9 C)   Resp 18   SpO2 100%  Physical Exam Vitals and nursing note reviewed.  Constitutional:      Appearance: She is well-developed.  HENT:     Head: Normocephalic.  Pulmonary:      Effort: Pulmonary effort is normal.  Abdominal:     General: There is no distension.  Musculoskeletal:        General: Tenderness present. No swelling. Normal range of motion.     Cervical back: Normal range of motion.  Neurological:     Mental Status: She is alert and oriented to person, place, and time.  Psychiatric:        Mood and Affect: Mood normal.     ED Results / Procedures / Treatments   Labs (all labs ordered are listed, but only abnormal results are displayed) Labs Reviewed - No data to display  EKG None  Radiology DG Hip Unilat W or Wo Pelvis 2-3 Views Left  Result Date: 02/28/2023 CLINICAL DATA:  Left groin and left knee pain playing football yesterday EXAM: DG HIP (WITH OR WITHOUT PELVIS) 2-3V LEFT COMPARISON:  None Available. FINDINGS: There is no evidence of hip fracture or dislocation. There is no evidence of arthropathy or other focal bone abnormality. IMPRESSION: Negative. Electronically Signed   By: Minerva Fester M.D.   On: 02/28/2023 20:39    Procedures Procedures    Medications Ordered in ED Medications - No data to display  ED Course/ Medical Decision Making/ A&P  Medical Decision Making Patient reports twisting left groin area after being tackled while playing football.  Patient complains of pain with moving hip and leg  Amount and/or Complexity of Data Reviewed Radiology: ordered and independent interpretation performed.    Details: X-ray left hip shows no acute abnormality  Risk OTC drugs. Risk Details: Patient counseled on results I advised ibuprofen for discomfort return if any problems           Final Clinical Impression(s) / ED Diagnoses Final diagnoses:  Strain of groin, left, initial encounter    Rx / DC Orders ED Discharge Orders     None     An After Visit Summary was printed and given to the patient.     Elson Areas, [place] 02/28/23 2310    Terald Sleeper, MD 02/28/23  [phone]

## 2023-02-28 NOTE — Discharge Instructions (Signed)
Ibuprofen for soreness.  Follow up with Orthopaedist if pain persist past one week

## 2023-03-05 ENCOUNTER — Other Ambulatory Visit: Payer: Self-pay

## 2023-03-05 ENCOUNTER — Emergency Department (HOSPITAL_BASED_OUTPATIENT_CLINIC_OR_DEPARTMENT_OTHER)
Admission: EM | Admit: 2023-03-05 | Discharge: 2023-03-05 | Disposition: A | Discharge status: Home / Self Care | Payer: Self-pay | Attending: Emergency Medicine | Admitting: Emergency Medicine

## 2023-03-05 ENCOUNTER — Emergency Department (HOSPITAL_BASED_OUTPATIENT_CLINIC_OR_DEPARTMENT_OTHER): Payer: Self-pay | Admitting: Radiology

## 2023-03-05 ENCOUNTER — Encounter (HOSPITAL_BASED_OUTPATIENT_CLINIC_OR_DEPARTMENT_OTHER): Payer: Self-pay | Admitting: Emergency Medicine

## 2023-03-05 DIAGNOSIS — S86912A Strain of unspecified muscle(s) and tendon(s) at lower leg level, left leg, initial encounter: Secondary | ICD-10-CM | POA: Insufficient documentation

## 2023-03-05 DIAGNOSIS — Y9361 Activity, american tackle football: Secondary | ICD-10-CM | POA: Insufficient documentation

## 2023-03-05 DIAGNOSIS — X58XXXA Exposure to other specified factors, initial encounter: Secondary | ICD-10-CM | POA: Insufficient documentation

## 2023-03-05 NOTE — ED Triage Notes (Signed)
Pt arrives to ED with c/o left knee pain since 5/20 after playing football. She notes she was seen here on 5/19 and dx with strain of her groin.

## 2023-03-05 NOTE — ED Provider Notes (Signed)
[place] EMERGENCY DEPARTMENT AT [facility] Provider Note   CSN: [id number] Arrival date & time: 03/05/23  4782     History  Chief Complaint  Patient presents with   Knee Pain    Madison Harper is a 32 y.o. female.  Patient presents with left knee pain worsening since the 20th.  Patient was seen on the 19th for left hip pain.  Patient plays contact football but does not recall any specific direct trauma.  No fevers or chills.       Home Medications Prior to Admission medications   Medication Sig Start Date End Date Taking? Authorizing Provider  cephALEXin (KEFLEX) 500 MG capsule Take 1 capsule (500 mg total) by mouth 4 (four) times daily. 05/28/22   Molpus, John, MD  fluconazole (DIFLUCAN) 150 MG tablet Take 1 tablet as needed for vaginal yeast infection.  May repeat in 3 days if symptoms persist. 05/28/22   Molpus, Jonny Ruiz, MD  lidocaine (XYLOCAINE) 2 % solution Use as directed 15 mLs in the mouth or throat as needed for mouth pain. 09/30/22   Gareth Eagle, PA-C  polyethylene glycol (MIRALAX / GLYCOLAX) 17 g packet Take 17 g by mouth daily. 06/19/21   Wurst, [place], PA-C  SUMAtriptan (IMITREX) 50 MG tablet TAKE 1 TABLET BY MOUTH EVERY 2 HOURS AS NEEDED FOR MIGRAINE 01/28/21   Anabel Halon, MD      Allergies    Patient has no known allergies.    Review of Systems   Review of Systems  Constitutional:  Negative for chills and fever.  HENT:  Negative for congestion.   Eyes:  Negative for visual disturbance.  Respiratory:  Negative for shortness of breath.   Cardiovascular:  Negative for chest pain.  Gastrointestinal:  Negative for abdominal pain and vomiting.  Genitourinary:  Negative for dysuria and flank pain.  Musculoskeletal:  Positive for gait problem and joint swelling. Negative for back pain, neck pain and neck stiffness.  Skin:  Negative for rash.  Neurological:  Negative for light-headedness and headaches.    Physical Exam Updated Vital  Signs BP 138/81 (BP Location: Right Arm)   Pulse 72   Temp 97.9 F (36.6 C) (Oral)   Resp 18   SpO2 97%  Physical Exam Vitals and nursing note reviewed.  Constitutional:      General: She is not in acute distress.    Appearance: She is well-developed.  HENT:     Head: Normocephalic and atraumatic.     Mouth/Throat:     Mouth: Mucous membranes are moist.  Eyes:     General:        Right eye: No discharge.        Left eye: No discharge.     Conjunctiva/sclera: Conjunctivae normal.  Neck:     Trachea: No tracheal deviation.  Cardiovascular:     Rate and Rhythm: Normal rate.  Pulmonary:     Effort: Pulmonary effort is normal.  Abdominal:     General: There is no distension.  Musculoskeletal:        General: Swelling and tenderness present. No deformity.     Cervical back: Normal range of motion and neck supple. No rigidity.     Comments: Patient has tenderness and minimal swelling anterior left knee worse with meniscal testing.  No ligament laxity.  No focal bony tenderness, mild pain with flexion.  Skin:    General: Skin is warm.     Capillary Refill: Capillary refill takes less than  2 seconds.     Findings: No rash.  Neurological:     General: No focal deficit present.     Mental Status: She is alert.  Psychiatric:        Mood and Affect: Mood normal.     ED Results / Procedures / Treatments   Labs (all labs ordered are listed, but only abnormal results are displayed) Labs Reviewed - No data to display  EKG None  Radiology DG Knee Complete 4 Views Left  Result Date: 03/05/2023 CLINICAL DATA:  Left knee pain, football injury EXAM: LEFT KNEE - COMPLETE 4+ VIEW COMPARISON:  None Available. FINDINGS: No fracture or dislocation is seen. The joint spaces are preserved. Visualized soft tissues are within normal limits. No suprapatellar knee joint effusion. IMPRESSION: Negative. Electronically Signed   By: Charline Bills M.D.   On: 03/05/2023 08:17     Procedures Procedures    Medications Ordered in ED Medications - No data to display  ED Course/ Medical Decision Making/ A&P                             Medical Decision Making Amount and/or Complexity of Data Reviewed Radiology: ordered.   Patient presents with left knee injury/strain.  X-ray ordered independently reviewed no acute fractures.  Differentials include general strain, meniscal injury, ligament strain, bony contusion.  Patient has crutches already.  Medical records reviewed and left hip x-ray was done on the 19th and did not show fracture.  Discussed follow-up with orthopedics and work note given.  Patient comfortable plan.        Final Clinical Impression(s) / ED Diagnoses Final diagnoses:  Knee strain, left, initial encounter    Rx / DC Orders ED Discharge Orders     None         Blane Ohara, MD 03/05/23 [phone]

## 2023-03-05 NOTE — ED Notes (Signed)
ED Provider at bedside. 

## 2023-03-05 NOTE — Discharge Instructions (Addendum)
Your xray did not show broken bones. Tylenol every 4 hours and ibuprofen/Motrin every 6 as needed for pain.  You can take them together every 6 hours as needed.  Use ice packs regularly.  Crutches as needed until you see the bone doctor.

## 2023-10-01 ENCOUNTER — Encounter (HOSPITAL_BASED_OUTPATIENT_CLINIC_OR_DEPARTMENT_OTHER): Payer: Self-pay

## 2023-10-01 ENCOUNTER — Emergency Department (HOSPITAL_BASED_OUTPATIENT_CLINIC_OR_DEPARTMENT_OTHER)
Admission: EM | Admit: 2023-10-01 | Discharge: 2023-10-02 | Disposition: A | Discharge status: Home / Self Care | Payer: Self-pay | Attending: Emergency Medicine | Admitting: Emergency Medicine

## 2023-10-01 ENCOUNTER — Emergency Department (HOSPITAL_BASED_OUTPATIENT_CLINIC_OR_DEPARTMENT_OTHER): Payer: Self-pay

## 2023-10-01 ENCOUNTER — Other Ambulatory Visit: Payer: Self-pay

## 2023-10-01 DIAGNOSIS — E876 Hypokalemia: Secondary | ICD-10-CM | POA: Insufficient documentation

## 2023-10-01 DIAGNOSIS — R0602 Shortness of breath: Secondary | ICD-10-CM | POA: Insufficient documentation

## 2023-10-01 DIAGNOSIS — R0789 Other chest pain: Secondary | ICD-10-CM | POA: Insufficient documentation

## 2023-10-01 DIAGNOSIS — R079 Chest pain, unspecified: Secondary | ICD-10-CM

## 2023-10-01 DIAGNOSIS — R200 Anesthesia of skin: Secondary | ICD-10-CM | POA: Insufficient documentation

## 2023-10-01 LAB — CBC
HCT: 39.9 % (ref 36.0–46.0)
Hemoglobin: 13 g/dL (ref 12.0–15.0)
MCH: 28.8 pg (ref 26.0–34.0)
MCHC: 32.6 g/dL (ref 30.0–36.0)
MCV: 88.5 fL (ref 80.0–100.0)
Platelets: 310 10*3/uL (ref 150–400)
RBC: 4.51 MIL/uL (ref 3.87–5.11)
RDW: 12.9 % (ref 11.5–15.5)
WBC: 5.5 10*3/uL (ref 4.0–10.5)
nRBC: 0 % (ref 0.0–0.2)

## 2023-10-01 LAB — BASIC METABOLIC PANEL
Anion gap: 8 (ref 5–15)
BUN: 12 mg/dL (ref 6–20)
CO2: 29 mmol/L (ref 22–32)
Calcium: 9.5 mg/dL (ref 8.9–10.3)
Chloride: 100 mmol/L (ref 98–111)
Creatinine, Ser: 0.83 mg/dL (ref 0.44–1.00)
GFR, Estimated: >60 mL/min (ref >60)
Glucose, Bld: 83 mg/dL (ref 70–99)
Potassium: 3.4 mmol/L — ABNORMAL LOW (ref 3.5–5.1)
Sodium: 137 mmol/L (ref 135–145)

## 2023-10-01 LAB — TROPONIN I (HIGH SENSITIVITY): Troponin I (High Sensitivity): <2 ng/L (ref <18)

## 2023-10-01 NOTE — ED Triage Notes (Signed)
Pt c/o CP x3 days, "started all over, now it's L side." Associated SHOB, lightheaded. Denies URI symptoms, NV, states it's "just a pain, my L arm goes numb." States episodes are intermittent, denies hx

## 2023-10-02 MED ORDER — POTASSIUM CHLORIDE CRYS ER 20 MEQ PO TBCR
40.0000 meq | EXTENDED_RELEASE_TABLET | Freq: Once | ORAL | Status: AC
  Administered 2023-10-02: 40 meq via ORAL
  Filled 2023-10-02: qty 2

## 2023-10-02 NOTE — Discharge Instructions (Addendum)
He evaluation did not show any of the serious causes for chest pain.  Specifically, there is no sign of a heart attack, pneumonia, blood clot in your lungs.  When pain is present, try applying ice.  Ice can be applied for up to 30 minutes at a time, 3 or 4 times a day.  You may take ibuprofen or naproxen as needed for pain.  If you need additional pain relief, you may add acetaminophen.  Return if you have any new or concerning symptoms.

## 2023-10-02 NOTE — ED Provider Notes (Signed)
[place] EMERGENCY DEPARTMENT AT [facility] Provider Note   CSN: [id number] Arrival date & time: 10/01/23  2112     History  Chief Complaint  Patient presents with   Chest Pain    Madison Harper is a 32 y.o. female.  The history is provided by the patient.  Chest Pain She has been having intermittent chest pain for the last 3 days.  Pain is sometimes left-sided and sometimes in the mid chest.  There is some associated numbness in her left arm but no true radiation of pain.  Pain is described as dull.  She did note some slight shortness of breath with her pain today but denies nausea or diaphoresis.  She denies any cough or fever.  She denies any recent trauma.  Nothing makes her pain better, nothing makes it worse.  Pain only lasts a few minutes at a time before resolving spontaneously.  She is a non-smoker and denies history of hypertension or diabetes or hyperlipidemia.  There is no family history of premature coronary atherosclerosis in first-degree relatives.  She denies use of exogenous estrogens.  She denies history of recent travel or surgery.   Home Medications Prior to Admission medications   Medication Sig Start Date End Date Taking? Authorizing Provider  polyethylene glycol (MIRALAX / GLYCOLAX) 17 g packet Take 17 g by mouth daily. 06/19/21   Wurst, [place], PA-C  SUMAtriptan (IMITREX) 50 MG tablet TAKE 1 TABLET BY MOUTH EVERY 2 HOURS AS NEEDED FOR MIGRAINE 01/28/21   Anabel Halon, MD      Allergies    Patient has no known allergies.    Review of Systems   Review of Systems  Cardiovascular:  Positive for chest pain.  All other systems reviewed and are negative.   Physical Exam Updated Vital Signs BP 111/70   Pulse 70   Temp 97.9 F (36.6 C)   Resp 20   Ht 5\' 4"  (1.626 m)   Wt 56 kg   SpO2 96%   BMI 21.19 kg/m  Physical Exam Vitals and nursing note reviewed.   32 year old female, resting comfortably and in no acute distress. Vital signs  are normal. Oxygen saturation is 96%, which is normal. Head is normocephalic and atraumatic. PERRLA, EOMI. Oropharynx is clear. Neck is nontender and supple without adenopathy. Lungs are clear without rales, wheezes, or rhonchi. Chest is mildly tender in the left upper anterior chest, and this does reproduce her pain.  There is no crepitus. Heart has regular rate and rhythm without murmur. Abdomen is soft, flat, nontender. Extremities have no cyanosis or edema, full range of motion is present. Skin is warm and dry without rash. Neurologic: Mental status is normal, moves all extremities equally, no objective sensory deficits.  ED Results / Procedures / Treatments   Labs (all labs ordered are listed, but only abnormal results are displayed) Labs Reviewed  BASIC METABOLIC PANEL - Abnormal; Notable for the following components:      Result Value   Potassium 3.4 (*)    All other components within normal limits  CBC  PREGNANCY, URINE  TROPONIN I (HIGH SENSITIVITY)    EKG EKG Interpretation Date/Time:  Friday October 01 2023 21:26:51 EST Ventricular Rate:  96 PR Interval:  178 QRS Duration:  86 QT Interval:  368 QTC Calculation: 464 R Axis:   84  Text Interpretation: Normal sinus rhythm Normal ECG When compared with ECG of 10-Nov-2014 06:48, HEART RATE has decreased Confirmed by Dione Booze ([id number])  on 10/02/2023 12:46:15 AM  Radiology DG Chest Port 1 View Result Date: 10/01/2023 CLINICAL DATA:  Chest pain for 3 days. Shortness of breath and lightheadedness. EXAM: PORTABLE CHEST 1 VIEW COMPARISON:  10/26/2009 FINDINGS: Normal heart size and pulmonary vascularity. No focal airspace disease or consolidation in the lungs. No blunting of costophrenic angles. No pneumothorax. Mediastinal contours appear intact. Bilateral cervical ribs, greater on the right. IMPRESSION: No active disease. Electronically Signed   By: Burman Nieves M.D.   On: 10/01/2023 23:56    Procedures Procedures     Medications Ordered in ED Medications  potassium chloride SA (KLOR-CON M) CR tablet 40 mEq (has no administration in time range)    ED Course/ Medical Decision Making/ A&P             HEART Score: 0                Geneva (Revised) Score: 0, Geneva Score Interpretation: Low Risk Group: 7-9% incidence of pulmonary embolism from several studies PERC Score: 0, PERC Score Interpretation: No need for further workup, as <2% chance of PE.  If no criteria are positive and clinicians pre-test probability is <15%, PERC Rule criteria are satisfied Medical Decision Making Amount and/or Complexity of Data Reviewed Labs: ordered.   Chest pain which seems most likely to be musculoskeletal.  Chest x-ray shows no evidence of pneumonia.  Have independently viewed the images, and agree with the radiologist interpretation.  I have reviewed her electrocardiogram, my interpretation is normal ECG.  I have reviewed her laboratory tests and my interpretation is normal troponin, borderline low potassium, normal CBC, negative pregnancy test.  I have ordered a dose of oral potassium.  Heart score is 0, which puts her at low risk for major adverse cardiac events in the next 6 weeks.  PERC score is 0 and Geneva score is 0 effectively ruling out pulmonary embolism.  I have advised the patient to apply ice and use over-the-counter NSAIDs and acetaminophen as needed for pain.  Return for new or concerning symptoms.  Final Clinical Impression(s) / ED Diagnoses Final diagnoses:  Nonspecific chest pain  Hypokalemia    Rx / DC Orders ED Discharge Orders     None         Dione Booze, MD 10/02/23 0110

## 2023-12-02 ENCOUNTER — Emergency Department (HOSPITAL_COMMUNITY)
Admission: EM | Admit: 2023-12-02 | Discharge: 2023-12-02 | Disposition: A | Discharge status: Home / Self Care | Payer: Managed Care, Other (non HMO) | Attending: Emergency Medicine | Admitting: Emergency Medicine

## 2023-12-02 ENCOUNTER — Encounter (HOSPITAL_COMMUNITY): Payer: Self-pay | Admitting: Emergency Medicine

## 2023-12-02 ENCOUNTER — Other Ambulatory Visit: Payer: Self-pay

## 2023-12-02 DIAGNOSIS — K625 Hemorrhage of anus and rectum: Secondary | ICD-10-CM | POA: Insufficient documentation

## 2023-12-02 DIAGNOSIS — K6289 Other specified diseases of anus and rectum: Secondary | ICD-10-CM | POA: Insufficient documentation

## 2023-12-02 LAB — CBC WITH DIFFERENTIAL/PLATELET
Abs Immature Granulocytes: 0.01 10*3/uL (ref 0.00–0.07)
Basophils Absolute: 0 10*3/uL (ref 0.0–0.1)
Basophils Relative: 1 %
Eosinophils Absolute: 0.1 10*3/uL (ref 0.0–0.5)
Eosinophils Relative: 2 %
HCT: 41.5 % (ref 36.0–46.0)
Hemoglobin: 13.4 g/dL (ref 12.0–15.0)
Immature Granulocytes: 0 %
Lymphocytes Relative: 37 %
Lymphs Abs: 1.6 10*3/uL (ref 0.7–4.0)
MCH: 28.9 pg (ref 26.0–34.0)
MCHC: 32.3 g/dL (ref 30.0–36.0)
MCV: 89.6 fL (ref 80.0–100.0)
Monocytes Absolute: 0.4 10*3/uL (ref 0.1–1.0)
Monocytes Relative: 10 %
Neutro Abs: 2.2 10*3/uL (ref 1.7–7.7)
Neutrophils Relative %: 50 %
Platelets: 323 10*3/uL (ref 150–400)
RBC: 4.63 MIL/uL (ref 3.87–5.11)
RDW: 13.2 % (ref 11.5–15.5)
WBC: 4.4 10*3/uL (ref 4.0–10.5)
nRBC: 0 % (ref 0.0–0.2)

## 2023-12-02 LAB — COMPREHENSIVE METABOLIC PANEL
ALT: 20 U/L (ref 0–44)
AST: 22 U/L (ref 15–41)
Albumin: 4.4 g/dL (ref 3.5–5.0)
Alkaline Phosphatase: 71 U/L (ref 38–126)
Anion gap: 8 (ref 5–15)
BUN: 12 mg/dL (ref 6–20)
CO2: 27 mmol/L (ref 22–32)
Calcium: 9.3 mg/dL (ref 8.9–10.3)
Chloride: 103 mmol/L (ref 98–111)
Creatinine, Ser: 0.57 mg/dL (ref 0.44–1.00)
GFR, Estimated: >60 mL/min (ref >60)
Glucose, Bld: 87 mg/dL (ref 70–99)
Potassium: 4.9 mmol/L (ref 3.5–5.1)
Sodium: 138 mmol/L (ref 135–145)
Total Bilirubin: 0.7 mg/dL (ref 0.0–1.2)
Total Protein: 8 g/dL (ref 6.5–8.1)

## 2023-12-02 LAB — TYPE AND SCREEN
ABO/RH(D): B POS
Antibody Screen: NEGATIVE

## 2023-12-02 LAB — POC OCCULT BLOOD, ED

## 2023-12-02 LAB — HCG, SERUM, QUALITATIVE: Preg, Serum: NEGATIVE

## 2023-12-02 MED ORDER — HYDROCORTISONE ACETATE 25 MG RE SUPP: 25.0000 mg | Freq: Two times a day (BID) | RECTAL | 0 refills | Status: AC

## 2023-12-02 NOTE — ED Provider Notes (Signed)
[place] EMERGENCY DEPARTMENT AT [facility] Provider Note   CSN: [id number] Arrival date & time: 12/02/23  0102     History  Chief Complaint  Patient presents with   Rectal Bleeding    Madison Harper is a 33 y.o. female.  HPI 33 year old female presents with a couple days of rectal bleeding.  She states at first a couple days ago it was a little bit of blood dripping out of her rectum after a bowel movement.  It was in the toilet only.  Yesterday she had a brown bowel movement but it had blood in it (dark red).  Today again she had some dripping and blood after her bowel movement.  No abdominal pain although currently she is experiencing some rectal pain.  She is not on blood thinners.  She had this once before a few years ago but never ended up getting a diagnosis. No pain with bowel movements.  Home Medications Prior to Admission medications   Medication Sig Start Date End Date Taking? Authorizing Provider  hydrocortisone (ANUSOL-HC) 25 MG suppository Place 1 suppository (25 mg total) rectally 2 (two) times daily. 12/02/23  Yes Pricilla Loveless, MD  polyethylene glycol (MIRALAX / GLYCOLAX) 17 g packet Take 17 g by mouth daily. 06/19/21   Wurst, [place], PA-C  SUMAtriptan (IMITREX) 50 MG tablet TAKE 1 TABLET BY MOUTH EVERY 2 HOURS AS NEEDED FOR MIGRAINE 01/28/21   Anabel Halon, MD      Allergies    Patient has no known allergies.    Review of Systems   Review of Systems  Gastrointestinal:  Positive for blood in stool. Negative for abdominal pain.    Physical Exam Updated Vital Signs BP 132/88   Pulse 76   Temp 98.9 F (37.2 C) (Oral)   Resp 18   Ht 5\' 4"  (1.626 m)   Wt 56.7 kg   SpO2 100%   BMI 21.46 kg/m  Physical Exam Vitals and nursing note reviewed. Exam conducted with a chaperone present.  Constitutional:      General: She is not in acute distress.    Appearance: She is well-developed. She is not ill-appearing or diaphoretic.  HENT:     Head:  Normocephalic and atraumatic.  Pulmonary:     Effort: Pulmonary effort is normal.  Abdominal:     Palpations: Abdomen is soft.     Tenderness: There is no abdominal tenderness.  Genitourinary:    Rectum: No mass or external hemorrhoid.     Comments: No gross blood on DRE. Skin:    General: Skin is warm and dry.  Neurological:     Mental Status: She is alert.     ED Results / Procedures / Treatments   Labs (all labs ordered are listed, but only abnormal results are displayed) Labs Reviewed  POC OCCULT BLOOD, ED - Normal  COMPREHENSIVE METABOLIC PANEL  CBC WITH DIFFERENTIAL/PLATELET  HCG, SERUM, QUALITATIVE  TYPE AND SCREEN    EKG None  Radiology No results found.  Procedures Procedures    Medications Ordered in ED Medications - No data to display  ED Course/ Medical Decision Making/ A&P                                 Medical Decision Making Amount and/or Complexity of Data Reviewed Labs: ordered.    Details: Normal hemoglobin.  Risk Prescription drug management.   Patient with a couple days  of dark red blood with BMs. Has some mild rectal discomfort, but no abnormalities on exam. Possibly from a hemorrhoid, will try Anusol but given she had this a couple years ago as well I think she will need referral to GI. VS are stable, normal hemoglobin and benign abdominal exam. I don't think she needs admission or acute imaging. Will discharge home with return precautions.         Final Clinical Impression(s) / ED Diagnoses Final diagnoses:  Rectal bleeding    Rx / DC Orders ED Discharge Orders          Ordered    hydrocortisone (ANUSOL-HC) 25 MG suppository  2 times daily        12/02/23 1357              Pricilla Loveless, MD 12/02/23 1416

## 2023-12-02 NOTE — Discharge Instructions (Addendum)
Follow-up with gastroenterology.  If you develop new or worsening bleeding, dizziness or lightheadedness, abdominal pain, rectal pain, or any other new/concerning symptoms then return to the ER or call 911.

## 2023-12-02 NOTE — ED Triage Notes (Signed)
Pt reports a hx of constipation, that has since somewhat resolved. However, yesterday she experienced chills and urge to pass a BM. Pt denies any pain, but passed what seemed like a normal bm. Pt reports dark blood in the toilet and dripping when finished and small clot when wiping. Pt denies any bleeding unless passing stool. Pt denies any trauma to the area and stated she has been to the ED for this in the past. During that occurrence pt reported straining and constipation. Those are not present during this occurrence.

## 2024-02-14 ENCOUNTER — Emergency Department (HOSPITAL_BASED_OUTPATIENT_CLINIC_OR_DEPARTMENT_OTHER): Admission: EM | Admit: 2024-02-14 | Discharge: 2024-02-14 | Disposition: A | Discharge status: Home / Self Care

## 2024-02-14 ENCOUNTER — Emergency Department (HOSPITAL_BASED_OUTPATIENT_CLINIC_OR_DEPARTMENT_OTHER): Admitting: Radiology

## 2024-02-14 ENCOUNTER — Encounter (HOSPITAL_BASED_OUTPATIENT_CLINIC_OR_DEPARTMENT_OTHER): Payer: Self-pay

## 2024-02-14 ENCOUNTER — Other Ambulatory Visit: Payer: Self-pay

## 2024-02-14 DIAGNOSIS — X509XXA Other and unspecified overexertion or strenuous movements or postures, initial encounter: Secondary | ICD-10-CM | POA: Diagnosis not present

## 2024-02-14 DIAGNOSIS — S92134A Nondisplaced fracture of posterior process of right talus, initial encounter for closed fracture: Secondary | ICD-10-CM

## 2024-02-14 DIAGNOSIS — S99911A Unspecified injury of right ankle, initial encounter: Secondary | ICD-10-CM | POA: Diagnosis present

## 2024-02-14 DIAGNOSIS — S92131A Displaced fracture of posterior process of right talus, initial encounter for closed fracture: Secondary | ICD-10-CM | POA: Diagnosis not present

## 2024-02-14 NOTE — ED Provider Notes (Signed)
  EMERGENCY DEPARTMENT AT [facility] Provider Note   CSN: [id number] Arrival date & time: 02/14/24  6606     History  Chief Complaint  Patient presents with   Ankle Pain    Madison Harper is a 33 y.o. female.  33 year old with right ankle pain and swelling.  Rolled ankle last night.  No numbness tingling changes in sensation.   Ankle Pain      Home Medications Prior to Admission medications   Medication Sig Start Date End Date Taking? Authorizing Provider  hydrocortisone  (ANUSOL -HC) 25 MG suppository Place 1 suppository (25 mg total) rectally 2 (two) times daily. 12/02/23   Jerilynn Montenegro, MD  polyethylene glycol (MIRALAX  / GLYCOLAX ) 17 g packet Take 17 g by mouth daily. 06/19/21   Wurst, [place], PA-C  SUMAtriptan  (IMITREX ) 50 MG tablet TAKE 1 TABLET BY MOUTH EVERY 2 HOURS AS NEEDED FOR MIGRAINE 01/28/21   Meldon Sport, MD      Allergies    Patient has no known allergies.    Review of Systems   Review of Systems  Physical Exam Updated Vital Signs BP 124/82 (BP Location: Left Arm)   Pulse 88   Temp 98.3 F (36.8 C) (Oral)   Resp 16   Ht 5\' 4"  (1.626 m)   Wt 54.4 kg   SpO2 100%   BMI 20.60 kg/m  Physical Exam Vitals and nursing note reviewed.  Constitutional:      General: She is not in acute distress.    Appearance: She is not toxic-appearing.  HENT:     Head: Normocephalic.     Nose: Nose normal.  Eyes:     Conjunctiva/sclera: Conjunctivae normal.  Cardiovascular:     Rate and Rhythm: Normal rate and regular rhythm.  Pulmonary:     Effort: Pulmonary effort is normal.     Breath sounds: Normal breath sounds.  Abdominal:     General: Abdomen is flat.  Musculoskeletal:     Comments: Diffuse swelling to the right ankle.  Global tenderness to palpation.  Joint stable.  2+ DP pulses.  Neurovascular intact.  Neurological:     Mental Status: She is alert.  Psychiatric:        Mood and Affect: Mood normal.        Behavior:  Behavior normal.     ED Results / Procedures / Treatments   Labs (all labs ordered are listed, but only abnormal results are displayed) Labs Reviewed - No data to display  EKG None  Radiology DG Ankle Complete Right Result Date: 02/14/2024 CLINICAL DATA:  fall, swelling and pain to right ankle. EXAM: RIGHT ANKLE - COMPLETE 3+ VIEW COMPARISON:  None Available. FINDINGS: There is a tiny avulsion fracture arising from the posterior most aspect of the posterior talar process. No other acute fracture or dislocation. No aggressive osseous lesion. Ankle mortise appears intact. No significant arthritis. Calcaneal spur noted along the Achilles tendon and Plantar aponeurosis attachment sites. No focal soft tissue swelling. No radiopaque foreign bodies. IMPRESSION: *Tiny avulsion fracture arising from the posterior talar process. Electronically Signed   By: Beula Brunswick M.D.   On: 02/14/2024 10:31    Procedures Procedures    Medications Ordered in ED Medications - No data to display  ED Course/ Medical Decision Making/ A&P                                 Medical Decision  Making Well-appearing 33 year old with right ankle pain since rolling her ankle last night.  X-ray with avulsion fracture of talus.  Placed in cam boot.  Follow-up with Ortho.   Amount and/or Complexity of Data Reviewed Radiology: ordered.          Final Clinical Impression(s) / ED Diagnoses Final diagnoses:  None    Rx / DC Orders ED Discharge Orders     None         Rolinda Climes, DO 02/14/24 1143

## 2024-02-14 NOTE — Discharge Instructions (Signed)
 Please use the crutches and remain nonweightbearing until you are seen by the orthopedic doctor.  Please call to schedule an appointment.  May take over-the-counter Tylenol  alternate with ibuprofen .  Remain in the cam boot until then.  You may remove to shower, but again do not place any weight on foot.

## 2024-02-14 NOTE — ED Triage Notes (Addendum)
 In for eval of right ankle pain, swelling, and bruising sec to fall last night approx 2000. Motor and sensation intact to right foot.

## 2024-03-20 ENCOUNTER — Encounter (HOSPITAL_COMMUNITY): Payer: Self-pay

## 2024-03-20 ENCOUNTER — Emergency Department (HOSPITAL_COMMUNITY)

## 2024-03-20 ENCOUNTER — Emergency Department (HOSPITAL_COMMUNITY)
Admission: EM | Admit: 2024-03-20 | Discharge: 2024-03-20 | Disposition: A | Discharge status: Home / Self Care | Attending: Emergency Medicine | Admitting: Emergency Medicine

## 2024-03-20 ENCOUNTER — Other Ambulatory Visit: Payer: Self-pay

## 2024-03-20 DIAGNOSIS — M25561 Pain in right knee: Secondary | ICD-10-CM | POA: Insufficient documentation

## 2024-03-20 MED ORDER — DICLOFENAC SODIUM 1 % EX GEL
4.0000 g | Freq: Four times a day (QID) | CUTANEOUS | 0 refills | Status: DC
Start: 2024-03-20 — End: 2024-07-05

## 2024-03-20 MED ORDER — LIDOCAINE 5 % EX PTCH
1.0000 | MEDICATED_PATCH | CUTANEOUS | Status: DC
  Administered 2024-03-20: 1 via TRANSDERMAL
  Filled 2024-03-20: qty 1

## 2024-03-20 NOTE — Discharge Instructions (Addendum)
 In today for right knee pain.  Your ultrasound was negative.  There is no sign of a DVT or other abnormality.  Your x-rays were also normal.  I prescribed topical Voltaren gel since the oral ibuprofen  was bothering your stomach, you can also take over-the-counter Tylenol .  Also provided you with a knee brace to help support your knee.  Follow-up closely with your PCP.  If you have any worsening symptoms such as fever, severe pain or other worrisome changes come back to the ER.

## 2024-03-20 NOTE — ED Provider Notes (Incomplete)
  [place] EMERGENCY DEPARTMENT AT [facility] Provider Note   CSN: [id number] Arrival date & time: 03/20/24  7829     History {Add pertinent medical, surgical, social history, OB history to HPI:1} Chief Complaint  Patient presents with   Knee Pain    Madison Harper is a 33 y.o. female.     Knee Pain      Home Medications Prior to Admission medications   Medication Sig Start Date End Date Taking? Authorizing Provider  hydrocortisone  (ANUSOL -HC) 25 MG suppository Place 1 suppository (25 mg total) rectally 2 (two) times daily. 12/02/23   Jerilynn Montenegro, MD  polyethylene glycol (MIRALAX  / GLYCOLAX ) 17 g packet Take 17 g by mouth daily. 06/19/21   Wurst, [place], PA-C  SUMAtriptan  (IMITREX ) 50 MG tablet TAKE 1 TABLET BY MOUTH EVERY 2 HOURS AS NEEDED FOR MIGRAINE 01/28/21   Meldon Sport, MD      Allergies    Patient has no known allergies.    Review of Systems   Review of Systems  Physical Exam Updated Vital Signs BP (!) 134/100 (BP Location: Right Arm)   Pulse 72   Temp 98.1 F (36.7 C) (Oral)   Resp 16   Ht 5\' 4"  (1.626 m)   Wt 54.4 kg   SpO2 100%   BMI 20.59 kg/m  Physical Exam  ED Results / Procedures / Treatments   Labs (all labs ordered are listed, but only abnormal results are displayed) Labs Reviewed - No data to display  EKG None  Radiology DG Knee Complete 4 Views Right Result Date: 03/20/2024 CLINICAL DATA:  Right knee pain for 2 months. Pain superior to the patella. EXAM: RIGHT KNEE - COMPLETE 4+ VIEW COMPARISON:  None Available. FINDINGS: Normal bone mineralization. Joint spaces are preserved. No acute fracture or dislocation. No joint effusion. IMPRESSION: Normal right knee radiographs. Electronically Signed   By: Bertina Broccoli M.D.   On: 03/20/2024 10:53    Procedures Procedures  {Document cardiac monitor, telemetry assessment procedure when appropriate:1}  Medications Ordered in ED Medications - No data to display  ED  Course/ Medical Decision Making/ A&P   {   Click here for ABCD2, HEART and other calculatorsREFRESH Note before signing :1}                              Medical Decision Making Amount and/or Complexity of Data Reviewed Radiology: ordered.  Risk Prescription drug management.   ***  {Document critical care time when appropriate:1} {Document review of labs and clinical decision tools ie heart score, Chads2Vasc2 etc:1}  {Document your independent review of radiology images, and any outside records:1} {Document your discussion with family members, caretakers, and with consultants:1} {Document social determinants of health affecting pt's care:1} {Document your decision making why or why not admission, treatments were needed:1} Final Clinical Impression(s) / ED Diagnoses Final diagnoses:  None    Rx / DC Orders ED Discharge Orders     None

## 2024-03-20 NOTE — ED Triage Notes (Signed)
 Pt arrived via POV c/o right knee pain that began after recent fracture to right ankle. Pt reports Hx of pain that comes and goes and reports she does have an ortho boot for her ankle but left it in the car. Pt ambulatory in Triage.

## 2024-07-05 ENCOUNTER — Other Ambulatory Visit: Payer: Self-pay

## 2024-07-05 ENCOUNTER — Encounter (HOSPITAL_COMMUNITY): Payer: Self-pay

## 2024-07-05 ENCOUNTER — Emergency Department (HOSPITAL_COMMUNITY)
Admission: EM | Admit: 2024-07-05 | Discharge: 2024-07-05 | Disposition: A | Discharge status: Home / Self Care | Payer: PRIVATE HEALTH INSURANCE | Attending: Emergency Medicine | Admitting: Emergency Medicine

## 2024-07-05 ENCOUNTER — Emergency Department (HOSPITAL_COMMUNITY): Payer: PRIVATE HEALTH INSURANCE

## 2024-07-05 DIAGNOSIS — R002 Palpitations: Secondary | ICD-10-CM | POA: Diagnosis present

## 2024-07-05 LAB — BASIC METABOLIC PANEL WITH GFR
Anion gap: 9 (ref 5–15)
BUN: 9 mg/dL (ref 6–20)
CO2: 27 mmol/L (ref 22–32)
Calcium: 9.4 mg/dL (ref 8.9–10.3)
Chloride: 101 mmol/L (ref 98–111)
Creatinine, Ser: 0.66 mg/dL (ref 0.44–1.00)
GFR, Estimated: >60 mL/min (ref >60)
Glucose, Bld: 93 mg/dL (ref 70–99)
Potassium: 4.4 mmol/L (ref 3.5–5.1)
Sodium: 137 mmol/L (ref 135–145)

## 2024-07-05 LAB — CBC
HCT: 39.9 % (ref 36.0–46.0)
Hemoglobin: 13.2 g/dL (ref 12.0–15.0)
MCH: 29.1 pg (ref 26.0–34.0)
MCHC: 33.1 g/dL (ref 30.0–36.0)
MCV: 88.1 fL (ref 80.0–100.0)
Platelets: 338 K/uL (ref 150–400)
RBC: 4.53 MIL/uL (ref 3.87–5.11)
RDW: 12.6 % (ref 11.5–15.5)
WBC: 5.7 K/uL (ref 4.0–10.5)
nRBC: 0 % (ref 0.0–0.2)

## 2024-07-05 LAB — POC URINE PREG, ED: Preg Test, Ur: NEGATIVE

## 2024-07-05 LAB — MAGNESIUM: Magnesium: 2.1 mg/dL (ref 1.7–2.4)

## 2024-07-05 LAB — D-DIMER, QUANTITATIVE: D-Dimer, Quant: <0.27 ug{FEU}/mL (ref 0.00–0.50)

## 2024-07-05 LAB — TSH: TSH: 1.493 u[IU]/mL (ref 0.350–4.500)

## 2024-07-05 NOTE — ED Triage Notes (Signed)
 Pt arrived via POV c/o right side chest pain that began 2 days ago. Pt denies injury, denies SOB.

## 2024-07-05 NOTE — ED Provider Notes (Signed)
 [place] EMERGENCY DEPARTMENT AT [facility] Provider Note   CSN: [id number] Arrival date & time: 07/05/24  9146     Patient presents with: Chest Pain   Madison Harper is a 33 y.o. female with a history including amenorrhea, allergic rhinitis, history of migraines presenting with a 2-day history of right sided chest pain along with episodic shortness of breath which started 2 days ago.  She describes sharp episodes of pain in her right upper chest without radiation without recognized triggers, can occur at rest or with exertion, she has had several episodes of feeling short of breath as well.  She also endorses generalized weakness as well.  She states she spent all of last night mostly awake due to palpitations.  She denies excessive caffeine use, no unexplained weight loss, generally has a good appetite but had no appetite yesterday.  She has had no fevers or chills, denies cough, no chest injury.  She denies peripheral edema.  No abdominal pain, nausea.  Denies urinary symptoms.  No medications or OTC's, drugs.   The history is provided by the patient.       Prior to Admission medications   Medication Sig Start Date End Date Taking? Authorizing Provider  ibuprofen  (ADVIL ) 200 MG tablet Take 800 mg by mouth every 6 (six) hours as needed for moderate pain (pain score 4-6).   Yes [provider]  hydrocortisone  (ANUSOL -HC) 25 MG suppository Place 1 suppository (25 mg total) rectally 2 (two) times daily. 12/02/23   Freddi Hamilton, MD    Allergies: Patient has no known allergies.    Review of Systems  Constitutional:  Negative for chills and fever.  HENT:  Negative for congestion.   Eyes: Negative.   Respiratory:  Positive for shortness of breath. Negative for chest tightness.   Cardiovascular:  Positive for chest pain and palpitations. Negative for leg swelling.  Gastrointestinal:  Negative for abdominal pain, nausea and vomiting.  Genitourinary: Negative.    Musculoskeletal:  Negative for arthralgias, joint swelling and neck pain.  Skin: Negative.  Negative for rash and wound.  Neurological:  Negative for dizziness, weakness, light-headedness, numbness and headaches.  Psychiatric/Behavioral: Negative.      Updated Vital Signs BP 118/81   Pulse 90   Temp 97.6 F (36.4 C) (Oral)   Resp 13   Ht 5' 4 (1.626 m)   Wt 54.4 kg   SpO2 100%   BMI 20.59 kg/m   Physical Exam Vitals and nursing note reviewed.  Constitutional:      Appearance: She is well-developed.  HENT:     Head: Normocephalic and atraumatic.  Eyes:     Conjunctiva/sclera: Conjunctivae normal.  Cardiovascular:     Rate and Rhythm: Normal rate and regular rhythm.     Heart sounds: Normal heart sounds.  Pulmonary:     Effort: Pulmonary effort is normal.     Breath sounds: Normal breath sounds. No wheezing or rhonchi.  Abdominal:     General: Bowel sounds are normal.     Palpations: Abdomen is soft.     Tenderness: There is no abdominal tenderness.  Musculoskeletal:        General: Normal range of motion.     Cervical back: Normal range of motion.     Right lower leg: No edema.     Left lower leg: No edema.  Skin:    General: Skin is warm and dry.  Neurological:     Mental Status: She is alert.     (  all labs ordered are listed, but only abnormal results are displayed) Labs Reviewed  CBC  BASIC METABOLIC PANEL WITH GFR  MAGNESIUM  TSH  D-DIMER, QUANTITATIVE  POC URINE PREG, ED    EKG: EKG Interpretation Date/Time:  Wednesday July 05 2024 10:22:31 EDT Ventricular Rate:  80 PR Interval:  174 QRS Duration:  84 QT Interval:  396 QTC Calculation: 457 R Axis:   68  Text Interpretation: Sinus rhythm No significant change since prior 12/24 Confirmed by Towana Sharper [phone]) on 07/05/2024 10:24:28 AM  Radiology: DG Chest Portable 1 View Result Date: 07/05/2024 CLINICAL DATA:  Right-sided chest pain. EXAM: PORTABLE CHEST 1 VIEW COMPARISON:   10/01/2023. FINDINGS: Trachea is midline. Heart size normal. Lungs are clear. No pleural fluid. IMPRESSION: Negative. Electronically Signed   By: Newell Eke M.D.   On: 07/05/2024 11:05     Procedures   Medications Ordered in the ED - No data to display                                  Medical Decision Making Patient presenting with complaint of intermittent palpitations, none during her ED stay today.  Labs and imaging are reassuring.  Broad differential including primary cardiac arrhythmia, electrolyte disturbance, anemia, hyperthyroidism, ingestion including caffeine although patient denies.  Patient will be referred to cardiology, she may benefit from wearing a event monitor.  Amount and/or Complexity of Data Reviewed Labs: ordered.    Details: Labs reviewed and are reassuring, negative D-dimer, B med and CBC are normal, magnesium level also normal range. Radiology: ordered.    Details: Chest x-ray reviewed and I agree with normal interpretation. ECG/medicine tests: ordered and independent interpretation performed.    Details: Normal sinus rhythm rate 80        Final diagnoses:  Palpitations    ED Discharge Orders          Ordered    Ambulatory referral to Cardiology        07/05/24 1208               Birdena Clarity, DEVONNA 07/05/24 1213    Towana Sharper BROCKS, MD 07/05/24 1718

## 2024-07-05 NOTE — Discharge Instructions (Addendum)
     Your labs,  chest x-ray and EKG are normal and reassuring today.  You would benefit from seeing a cardiologist we will probably have you wear a monitor to try and capture 1 of these episodes.  Expect a phone call from them within the next 1 to 2 days to arrange an office visit with them.

## 2024-07-10 ENCOUNTER — Ambulatory Visit: Payer: PRIVATE HEALTH INSURANCE | Admitting: Cardiology

## 2024-07-10 NOTE — Progress Notes (Deleted)
      Clinical Summary Ms. Blucher is a 33 y.o.female seen today as a new patient for the following medical problems.  1.Palpitations - ER visit 07/05/24 with palpitations - EKG NSR - K and Mg were normal, Ddimer negative. CXR no acute process    2.Chest pain  No past medical history on file.   No Known Allergies   Current Outpatient Medications  Medication Sig Dispense Refill   hydrocortisone  (ANUSOL -HC) 25 MG suppository Place 1 suppository (25 mg total) rectally 2 (two) times daily. 12 suppository 0   ibuprofen  (ADVIL ) 200 MG tablet Take 800 mg by mouth every 6 (six) hours as needed for moderate pain (pain score 4-6).     No current facility-administered medications for this visit.     Past Surgical History:  Procedure Laterality Date   WISDOM TOOTH EXTRACTION       No Known Allergies    Family History  Problem Relation Age of Onset   Hypertension Mother    Hypertension Father    Diabetes Father      Social History Ms. Mozingo reports that she has never smoked. She has never been exposed to tobacco smoke. She has never used smokeless tobacco. Ms. Eble reports current alcohol use.   Review of Systems CONSTITUTIONAL: No weight loss, fever, chills, weakness or fatigue.  HEENT: Eyes: No visual loss, blurred vision, double vision or yellow sclerae.No hearing loss, sneezing, congestion, runny nose or sore throat.  SKIN: No rash or itching.  CARDIOVASCULAR:  RESPIRATORY: No shortness of breath, cough or sputum.  GASTROINTESTINAL: No anorexia, nausea, vomiting or diarrhea. No abdominal pain or blood.  GENITOURINARY: No burning on urination, no polyuria NEUROLOGICAL: No headache, dizziness, syncope, paralysis, ataxia, numbness or tingling in the extremities. No change in bowel or bladder control.  MUSCULOSKELETAL: No muscle, back pain, joint pain or stiffness.  LYMPHATICS: No enlarged nodes. No history of splenectomy.  PSYCHIATRIC: No history of  depression or anxiety.  ENDOCRINOLOGIC: No reports of sweating, cold or heat intolerance. No polyuria or polydipsia.  SABRA   Physical Examination There were no vitals filed for this visit. There were no vitals filed for this visit.  Gen: resting comfortably, no acute distress HEENT: no scleral icterus, pupils equal round and reactive, no palptable cervical adenopathy,  CV Resp: Clear to auscultation bilaterally GI: abdomen is soft, non-tender, non-distended, normal bowel sounds, no hepatosplenomegaly MSK: extremities are warm, no edema.  Skin: warm, no rash Neuro:  no focal deficits Psych: appropriate affect   Diagnostic Studies     Assessment and Plan        Dorn PHEBE Ross, M.D., F.A.C.C.

## 2024-07-12 ENCOUNTER — Encounter: Payer: Self-pay | Admitting: General Practice

## 2024-09-08 ENCOUNTER — Ambulatory Visit: Payer: Self-pay

## 2024-09-28 ENCOUNTER — Encounter: Payer: PRIVATE HEALTH INSURANCE | Attending: Internal Medicine | Admitting: Internal Medicine

## 2024-09-28 NOTE — Progress Notes (Signed)
 Erroneous encounter - please disregard.

## 2024-09-29 ENCOUNTER — Encounter: Payer: Self-pay | Admitting: Internal Medicine

## 2024-10-11 ENCOUNTER — Encounter: Payer: Self-pay | Admitting: General Practice

## 2024-10-23 ENCOUNTER — Encounter (HOSPITAL_COMMUNITY): Payer: Self-pay | Admitting: *Deleted

## 2024-10-23 ENCOUNTER — Other Ambulatory Visit: Payer: Self-pay

## 2024-10-23 ENCOUNTER — Emergency Department (HOSPITAL_COMMUNITY)
Admission: EM | Admit: 2024-10-23 | Discharge: 2024-10-23 | Disposition: A | Discharge status: Home / Self Care | Payer: Self-pay | Attending: Emergency Medicine | Admitting: Emergency Medicine

## 2024-10-23 DIAGNOSIS — K068 Other specified disorders of gingiva and edentulous alveolar ridge: Secondary | ICD-10-CM | POA: Insufficient documentation

## 2024-10-23 DIAGNOSIS — M79646 Pain in unspecified finger(s): Secondary | ICD-10-CM | POA: Insufficient documentation

## 2024-10-23 DIAGNOSIS — M7989 Other specified soft tissue disorders: Secondary | ICD-10-CM | POA: Insufficient documentation

## 2024-10-23 MED ORDER — MELOXICAM 15 MG PO TABS: 15.0000 mg | ORAL_TABLET | Freq: Every day | ORAL | 0 refills | Status: AC

## 2024-10-23 NOTE — ED Provider Notes (Signed)
 " [place] EMERGENCY DEPARTMENT AT [facility] Provider Note   CSN: [id number] Arrival date & time: 10/23/24  9197     Patient presents with: Oral Swelling   Madison Harper is a 34 y.o. female.   HPI 34 year old female presenting with thumb pain and swelling.  Patient reports this been going on for about a month now.  She reports that the pain comes and goes however has been a little bit more constant recently.  She reports that she is been taking Tylenol  for the pain which helps.  She has been trying to get in with a dentist however they do not have any appointments.  She is currently on a wait list.    Prior to Admission medications  Medication Sig Start Date End Date Taking? Authorizing Provider  hydrocortisone  (ANUSOL -HC) 25 MG suppository Place 1 suppository (25 mg total) rectally 2 (two) times daily. 12/02/23   Freddi Hamilton, MD  ibuprofen  (ADVIL ) 200 MG tablet Take 800 mg by mouth every 6 (six) hours as needed for moderate pain (pain score 4-6).    [provider]    Allergies: Patient has no known allergies.    Review of Systems  All other systems reviewed and are negative.   Updated Vital Signs BP 131/80 (BP Location: Right Arm)   Pulse 67   Temp 97.9 F (36.6 C)   Resp 17   Ht 5' 4 (1.626 m)   Wt 56.7 kg   LMP  (LMP Unknown)   SpO2 98%   BMI 21.46 kg/m   Physical Exam Vitals and nursing note reviewed.  HENT:     Mouth/Throat:     Lips: Pink.     Mouth: Mucous membranes are moist.     Dentition: Normal dentition. Does not have dentures. No dental tenderness, gingival swelling, dental caries, dental abscesses or gum lesions.     Tongue: No lesions.     Palate: No mass and lesions.     Pharynx: Oropharynx is clear. Uvula midline. No pharyngeal swelling, oropharyngeal exudate, posterior oropharyngeal erythema, uvula swelling or postnasal drip.     Tonsils: No tonsillar exudate or tonsillar abscesses.     Comments: Tenderness to  palpation of the upper gums.  Majority of pain is only above the front to teeth.  No signs of dental caries.  No signs of dental abscesses.  No gum lesions.  No swelling noted. Eyes:     General: No scleral icterus. Pulmonary:     Effort: Pulmonary effort is normal.  Skin:    Coloration: Skin is not jaundiced.     Findings: No rash.  Neurological:     General: No focal deficit present.     Mental Status: She is alert.     (all labs ordered are listed, but only abnormal results are displayed) Labs Reviewed - No data to display  EKG: None  Radiology: No results found.   Procedures   Medications Ordered in the ED - No data to display                                  Medical Decision Making Risk Prescription drug management.   Impression: 34 year old female presenting with gum swelling and pain.  Differential diagnosis include dental caries, dental abscess, gum lesion, gum abscess  Additional History: Patient was able to provide history.  I also reviewed other outpatient notes.  Labs: None  Imaging: None  ED Course/Meds: 34 year old female presenting with thumb pain.  Patient was a well-appearing and in no acute distress female.  This pain has been going on for about a month now however has become more constant.  She has been trying to get in with a dentist however they have long wait times.  She has been taking Tylenol  for this pain which does seem to help.  On physical exam I did not note any dental abscesses, dental caries, or gum lesions.  She had tenderness to palpation of the gums.  The majority of the pain was in the front upper teeth.  Whenever I pressed she reports the pain a 5 out of 10.  I discussed with the patient that we could possibly do a CT to make sure that there were no other issues going on.  She reports that she did not want to do that at this time I told her that if she changed her mind she could come back at any time.  I discussed prescribing her a NSAID  and she liked that idea.  Meloxicam  was prescribed and sent to her pharmacy.  Educated on the importance of getting into see a dentist and I provided her some resources to other dentist.  I educated on signs and symptoms of when to return to the ER such as difficulty swallowing, difficulty speaking, difficulty breathing, chest pain, worsening pain.  Also informed that she should follow-up with her primary care if this pain continues.  Patient verbally agreed to all of the above.  Patient remained stable while in the ER and at discharge.      Final diagnoses:  None    ED Discharge Orders     None          Rosaline Almarie KANDICE DEVONNA 10/23/24 1252    Towana Ozell BROCKS, MD 10/23/24 1721  "

## 2024-10-23 NOTE — ED Triage Notes (Signed)
 Pt c/o lip swelling that is making her head hurt; pt states the problem started in December and has been happening intermittently  No swelling noted during triage assessment

## 2024-10-23 NOTE — Discharge Instructions (Addendum)
 Please follow-up with your dentist.  I have also provided you a referral for a primary care.  You may also follow-up with them if this pain continues.  You can start taking the meloxicam  as needed for pain.  If your pain worsens or you start to develop any difficulty breathing, swallowing, talking, or chest pain please return to the ER.  Below I have also provided some dental resources.  [facility] -  [address] [facility] - [place] [address] [phone]  [facility] clinic) treats patients who are in the following groups:  Pediatrics with medicaid up to age 6 Patients over 49 with an orange card and a referral from a medical provider.  - If they are referred - there is a 40$ copay for any service - extration etc.  To get an orange card the following process is necessary.  Call the [facility] to apply for card - [phone] Once approved, you the patient will be set up with a medical provider Medical provider will refer patient to the [facility] clinic .  Offices accepting Medicaid patients:  [facility]gent [facility] - [address] [place], [place] [address] [phone]  [facility] - [address], [place] [address] [phone]  [facility] (will see emergency dental patients from ED) - [address] [place], [place] [address] [phone]
